# Patient Record
Sex: Male | Born: 1970
Health system: Southern US, Community
[De-identification: ages and names within clinical notes are randomized; demographics above are authoritative.]

## PROBLEM LIST (undated history)

## (undated) DIAGNOSIS — F419 Anxiety disorder, unspecified: Secondary | ICD-10-CM

## (undated) DIAGNOSIS — K5790 Diverticulosis of intestine, part unspecified, without perforation or abscess without bleeding: Secondary | ICD-10-CM

## (undated) DIAGNOSIS — D126 Benign neoplasm of colon, unspecified: Secondary | ICD-10-CM

## (undated) DIAGNOSIS — F329 Major depressive disorder, single episode, unspecified: Secondary | ICD-10-CM

## (undated) DIAGNOSIS — G4730 Sleep apnea, unspecified: Secondary | ICD-10-CM

## (undated) DIAGNOSIS — F32A Depression, unspecified: Secondary | ICD-10-CM

## (undated) DIAGNOSIS — E079 Disorder of thyroid, unspecified: Secondary | ICD-10-CM

## (undated) HISTORY — DX: Disorder of thyroid, unspecified: E07.9

## (undated) HISTORY — DX: Depression, unspecified: F32.A

## (undated) HISTORY — PX: POLYPECTOMY: SHX149

## (undated) HISTORY — DX: Anxiety disorder, unspecified: F41.9

## (undated) HISTORY — PX: SHOULDER SURGERY: SHX246

## (undated) HISTORY — DX: Diverticulosis of intestine, part unspecified, without perforation or abscess without bleeding: K57.90

## (undated) HISTORY — DX: Major depressive disorder, single episode, unspecified: F32.9

## (undated) HISTORY — DX: Benign neoplasm of colon, unspecified: D12.6

## (undated) HISTORY — PX: WISDOM TOOTH EXTRACTION: SHX21

## (undated) HISTORY — PX: COLONOSCOPY: SHX174

## (undated) HISTORY — DX: Sleep apnea, unspecified: G47.30

---

## 2009-03-17 HISTORY — PX: LAPAROSCOPIC GASTRIC BANDING: SHX1100

## 2009-09-03 ENCOUNTER — Encounter (INDEPENDENT_AMBULATORY_CARE_PROVIDER_SITE_OTHER): Payer: Self-pay | Admitting: *Deleted

## 2009-09-24 ENCOUNTER — Ambulatory Visit: Payer: Self-pay | Admitting: Gastroenterology

## 2009-09-24 ENCOUNTER — Telehealth (INDEPENDENT_AMBULATORY_CARE_PROVIDER_SITE_OTHER): Payer: Self-pay

## 2009-09-27 ENCOUNTER — Encounter: Payer: Self-pay | Admitting: Internal Medicine

## 2009-10-23 ENCOUNTER — Ambulatory Visit (HOSPITAL_COMMUNITY): Admission: RE | Admit: 2009-10-23 | Discharge: 2009-10-23 | Payer: Self-pay | Admitting: Internal Medicine

## 2009-10-23 ENCOUNTER — Ambulatory Visit: Payer: Self-pay | Admitting: Internal Medicine

## 2009-10-25 ENCOUNTER — Encounter: Payer: Self-pay | Admitting: Internal Medicine

## 2009-12-14 ENCOUNTER — Ambulatory Visit: Payer: Self-pay | Admitting: Internal Medicine

## 2009-12-14 ENCOUNTER — Telehealth: Payer: Self-pay | Admitting: Internal Medicine

## 2009-12-14 DIAGNOSIS — K5732 Diverticulitis of large intestine without perforation or abscess without bleeding: Secondary | ICD-10-CM

## 2009-12-14 LAB — CONVERTED CEMR LAB
BUN: 10 mg/dL (ref 6–23)
Basophils Absolute: 0 10*3/uL (ref 0.0–0.1)
Basophils Relative: 0.1 % (ref 0.0–3.0)
CO2: 29 meq/L (ref 19–32)
Calcium: 8.9 mg/dL (ref 8.4–10.5)
Chloride: 102 meq/L (ref 96–112)
Creatinine, Ser: 1.1 mg/dL (ref 0.4–1.5)
Eosinophils Absolute: 0.1 10*3/uL (ref 0.0–0.7)
Eosinophils Relative: 1.3 % (ref 0.0–5.0)
GFR calc non Af Amer: 80.33 mL/min (ref >60)
Glucose, Bld: 90 mg/dL (ref 70–99)
HCT: 43.7 % (ref 39.0–52.0)
Hemoglobin: 14.8 g/dL (ref 13.0–17.0)
Lymphocytes Relative: 19.2 % (ref 12.0–46.0)
Lymphs Abs: 1.7 10*3/uL (ref 0.7–4.0)
MCHC: 34 g/dL (ref 30.0–36.0)
MCV: 85.2 fL (ref 78.0–100.0)
Monocytes Absolute: 0.6 10*3/uL (ref 0.1–1.0)
Monocytes Relative: 6.6 % (ref 3.0–12.0)
Neutro Abs: 6.4 10*3/uL (ref 1.4–7.7)
Neutrophils Relative %: 72.8 % (ref 43.0–77.0)
Platelets: 276 10*3/uL (ref 150.0–400.0)
Potassium: 4.4 meq/L (ref 3.5–5.1)
RBC: 5.12 M/uL (ref 4.22–5.81)
RDW: 13.8 % (ref 11.5–14.6)
Sodium: 140 meq/L (ref 135–145)
WBC: 8.8 10*3/uL (ref 4.5–10.5)

## 2009-12-17 ENCOUNTER — Ambulatory Visit: Payer: Self-pay | Admitting: Internal Medicine

## 2010-01-10 ENCOUNTER — Ambulatory Visit: Payer: Self-pay | Admitting: Internal Medicine

## 2010-01-10 DIAGNOSIS — Z860100 Personal history of colon polyps, unspecified: Secondary | ICD-10-CM | POA: Insufficient documentation

## 2010-04-16 NOTE — Miscellaneous (Signed)
Summary: [facility] PV- weight restriction  Clinical Lists Changes   Pt arrived for PV; weight 408 pounds.  Talked with Darcey Nora re: pt's need to have colonoscopy at hospital.  pt is 40 years old with family hx of colon ca (in father).  Lavonna Rua will give chart to Dr. Leone Payor to review to see if pt is due for colonoscopy now.

## 2010-04-16 NOTE — Assessment & Plan Note (Signed)
Summary: 2 WEEKS ABD. PAIN/? DIVERTICULITIS          Jeffrey Moody   History of Present Illness Primary GI Moody: Jeffrey Moody [facility] Chief Complaint: LLQ abd pain intermittent sharp pains with crampy pains in between x 2 weeks. Pt states he had small BM's after the cramping starts. Pt states he hasn't really ate much the last several weeks to help with the pain and he states it hurts after I eat.Pt has been on a round of ATB's without improvement. History of Present Illness:   40 yo wm with 2 weeks of pain in LLQ and saw PCP Jeffrey Moody) and received 7 days of cipro and metronidazole. It is a bit better but still having alot of pain and is constipated. Has avoided eating due to pain. No fever.    GI Review of Systems    Reports abdominal pain.     Location of  Abdominal pain: LLQ.    Denies acid reflux, belching, bloating, chest pain, dysphagia with liquids, dysphagia with solids, heartburn, loss of appetite, nausea, vomiting, vomiting blood, weight loss, and  weight gain.      Reports change in bowel habits.     Denies anal fissure, black tarry stools, constipation, diarrhea, diverticulosis, fecal incontinence, heme positive stool, hemorrhoids, irritable bowel syndrome, jaundice, light color stool, liver problems, rectal bleeding, and  rectal pain. Preventive Screening-Counseling & Management  Alcohol-Tobacco     Smoking Status: never      Drug Use:  no.      Current Medications (verified): 1)  Wellbutrin Xl 150 Mg Xr24h-Tab (Bupropion Hcl) .... One Tablet By Mouth Once Daily  Allergies (verified): No Known Drug Allergies  Past History:  Past Medical History: Diverticulosis Diverticulitis Obesity Depression  Past Surgical History: Unremarkable  Family History: Family History of Colon Cancer:Father, Cousin , Uncle  Social History: Married Patient has never smoked.  Alcohol Use - yes Daily Caffeine Use Illicit Drug Use - no Smoking Status:  never Drug Use:  no  Review of  Systems       increased sweats no fever  Vital Signs:  Patient profile:   40 year old male Height:      72 inches Weight:      399.38 pounds BMI:     54.36 Pulse rate:   88 / minute Pulse rhythm:   regular BP sitting:   126 / 78  (right arm) Cuff size:   regular  Vitals Entered By: Jeffrey Moody CMA Jeffrey Moody) (December 14, 2009 2:17 PM)  Physical Exam  General:  obese.  NAD Eyes:  anicteric Lungs:  Clear throughout to auscultation. Heart:  Regular rate and rhythm; no murmurs, rubs,  or bruits. Abdomen:  obese, mild-mod tender in deep LLQ BS+ no masses Skin:  moist, acneiform changes abdominal and groin area Psych:  Alert and cooperative. Normal mood and affect.   Impression & Recommendations:  Problem # 1:  DIVERTICULITIS-COLON (ICD-562.11) Assessment New sounds like it is the problem  Augmentin x 2 weeks CT to confirm hydrocodne liquid to low residue diet   Orders: CT Abdomen/Pelvis with Contrast (CT Abd/Pelvis w/con)  Other Orders: TLB-CBC Platelet - w/Differential (85025-CBCD) TLB-BMP (Basic Metabolic Panel-BMET) (80048-METABOL)  Patient Instructions: 1)  Please pick up your medications at your pharmacy. 2)  Your physician has requested that you have the following labwork done today: CBC, BMET (blood counts and electroltes/kidney function) 3)  Start with liquid diet and move to a low-residue diet when feeling better. 4)  You will have a CT scan on 12/17/09 and we will call you with results and plans 5)  Copy sent to : Jeffrey Found, Moody 6)  Diverticular Disease brochure given.  7)  The medication list was reviewed and reconciled.  All changed / newly prescribed medications were explained.  A complete medication list was provided to the patient / caregiver. Prescriptions: HYDROCODONE-ACETAMINOPHEN 5-500 MG TABS (HYDROCODONE-ACETAMINOPHEN) 1-2 by mouth every 4-6 hours as needed for pain  #20 x 0   Entered and Authorized by:   Jeffrey Boop Moody, [facility]   Signed  by:   Jeffrey Boop Moody, [facility] on 12/14/2009   Method used:   Printed then faxed [facility] ...       [facility]  [place] [address] [facility]ail[facility]       [address]       [place], [place]  [address]       Ph: [phone] or [phone]       Fax: [phone]   RxID:   [id number] AMOXICILLIN-POT CLAVULANATE 875-125 MG TABS (AMOXICILLIN-POT CLAVULANATE) 1 by mouth two times a day with food for 2 weeks  #28 x 0   Entered and Authorized by:   Jeffrey Boop Moody, [facility]   Signed by:   Jeffrey Boop Moody, [facility] on 12/14/2009   Method used:   [facility]        [facility]  [place] [address] [facility]retail[facility]       [address]       [place], [place]  [address]       Ph: [phone] or [phone]       Fax: [phone]   RxID:   [id number]

## 2010-04-16 NOTE — Letter (Signed)
Summary: Previsit letter  [facility]  [address] [place], [place] [address]   Phone: [phone]  Fax: [phone]       09/03/2009 MRN: [id number]  [facility] [address] [place], [place]  [address]  Dear Mr. Gato,  Welcome to the Gastroenterology Division at [facility].    You are scheduled to see a nurse for your pre-procedure visit on 09-24-09 at 2:00p.m. on the 3rd floor at [facility], [address]  We ask that you try to arrive at our office 15 minutes prior to your appointment time to allow for check-in.  Your nurse visit will consist of discussing your medical and surgical history, your immediate family medical history, and your medications.    Please bring a complete list of all your medications or, if you prefer, bring the medication bottles and we will list them.  We will need to be aware of both prescribed and over the counter drugs.  We will need to know exact dosage information as well.  If you are on blood thinners (Coumadin, Plavix, Aggrenox, Ticlid, etc.) please call our office today/prior to your appointment, as we need to consult with your physician about holding your medication.   Please be prepared to read and sign documents such as consent forms, a financial agreement, and acknowledgement forms.  If necessary, and with your consent, a friend or relative is welcome to sit-in on the nurse visit with you.  Please bring your insurance card so that we may make a copy of it.  If your insurance requires a referral to see a specialist, please bring your referral form from your primary care physician.  No co-pay is required for this nurse visit.     If you cannot keep your appointment, please call [phone] to cancel or reschedule prior to your appointment date.  This allows [place] the opportunity to schedule an appointment for another patient in need of care.    Thank you for choosing [facility] for your medical  needs.  We appreciate the opportunity to care for you.  Please visit [place] at our website  to learn more about our practice.                     Sincerely.                                                                                                                   The Gastroenterology Division

## 2010-04-16 NOTE — Progress Notes (Signed)
Summary: Triage-Abd. Pain  Phone Note Call from Patient Call back at Work Phone [phone]   Caller: Patient Call For: Dr. Leone Payor Reason for Call: Talk to Nurse Summary of Call: Severe abd pain/cramping x2 wks Initial call taken by: Karna Christmas,  December 14, 2009 8:29 AM  Follow-up for Phone Call        Message left for patient to callback. Laureen Ochs LPN  December 14, 2009 10:29 AM  Had a Colonoscopy 10-23-09. Pt. c/o 2 weeks of lower abd. cramping, pain is intermittent. Pt. modified his diet, pain is remaining the same. May be constipated/bloated. "I almost feel like I'm in a knot." Denies fever, blood, black stools, n/v.  PCP put him on Cipro/Flagyl 2 weeks ago, not much improvement.   DR.GESSNER PLEASE ADVISE  Follow-up by: Laureen Ochs LPN,  December 14, 2009 10:57 AM  Additional Follow-up for Phone Call Additional follow up Details #1::        Please get a bowel moement hx (how many in the lastfew dasy) if he is constipated then he may just need laxatives he could be seen at 215 today if necessary if he has not had a good bm in past few days I would recomend laxatives...let me know  if he had his obesity surgery then he should be talkingto his surgeon Additional Follow-up by: Iva Boop MD, [facility] Graham,  December 14, 2009 11:38 AM    Additional Follow-up for Phone Call Additional follow up Details #2::    Last BM was "Technically" this morning, had a small movement, little pellets.  Had a substantial BM about 2 weeks ago, "I have been eating almost nothing because of this pain." Pt. has not had bariatric surgery. Pt. would prefer to be seen today, so he will come for the 2:15pm appt. today.  Follow-up by: Laureen Ochs LPN,  December 14, 2009 11:48 AM

## 2010-04-16 NOTE — Letter (Signed)
Summary: Patient Notice- Polyp Results  [facility]  [address] [place], [place] [address]   Phone: [phone]  Fax: [phone]        October 25, 2009 MRN: [id number]    [facility] [address] [place], [place]  [address]    Dear [name],  The polyp removed from your colon was adenomatous. This means that it was pre-cancerous or that  it had the potential to change into cancer over time.  I recommend that you have a repeat colonoscopy in 3 years given this finding and your family history of colon cancer. If you develop any new rectal bleeding, abdominal pain or significant bowel habit changes, please contact us before then.  I also recommend that you pursue genetic testing as we discussed and you may contact me to arrange this.  Please call us if you are having persistent problems or have questions about your condition that have not been fully answered at this time.  Good luck with your bariatric surgery!  Sincerely,  Iva Boop MD, [facility]  This letter has been electronically signed by your physician.  Appended Document: Patient Notice- Polyp Results Letter mailed to patient. Recall is in IDX for 10/2012.

## 2010-04-16 NOTE — Letter (Signed)
Summary: [facility] Instructions  [facility]  [address] [place], [place] [address]   Phone: [phone]  Fax: [phone]       Jeffrey Moody    Jan 03, 1971    MRN: [id number]        Procedure Day /Date: Tuesday 10/23/09     Arrival Time:7:30 am      Procedure Time: 8:30 am     Location of Procedure:                     _x _  [facility] ( Outpatient Registration)   PREPARATION FOR COLONOSCOPY WITH MOVIPREP   Starting 5 days prior to your procedure 10/18/09 do not eat nuts, seeds, popcorn, corn, beans, peas,  salads, or any raw vegetables.  Do not take any fiber supplements (e.g. Metamucil, Citrucel, and Benefiber).  THE DAY BEFORE YOUR PROCEDURE         DATE: 10/22/09  DAY: Monday  1.  Drink clear liquids the entire day-NO SOLID FOOD  2.  Do not drink anything colored red or purple.  Avoid juices with pulp.  No orange juice.  3.  Drink at least 64 oz. (8 glasses) of fluid/clear liquids during the day to prevent dehydration and help the prep work efficiently.  CLEAR LIQUIDS INCLUDE: Water Jello Ice Popsicles Tea (sugar ok, no milk/cream) Powdered fruit flavored drinks Coffee (sugar ok, no milk/cream) Gatorade Juice: apple, white grape, white cranberry  Lemonade Clear bullion, consomm, broth Carbonated beverages (any kind) Strained chicken noodle soup Hard Candy                             4.  In the morning, mix first dose of MoviPrep solution:    Empty 1 Pouch A and 1 Pouch B into the disposable container    Add lukewarm drinking water to the top line of the container. Mix to dissolve    Refrigerate (mixed solution should be used within 24 hrs)  5.  Begin drinking the prep at 5:00 p.m. The MoviPrep container is divided by 4 marks.   Every 15 minutes drink the solution down to the next mark (approximately 8 oz) until the full liter is complete.   6.  Follow completed prep with 16 oz of clear liquid of your choice (Nothing red or  purple).  Continue to drink clear liquids until bedtime.  7.  Before going to bed, mix second dose of MoviPrep solution:    Empty 1 Pouch A and 1 Pouch B into the disposable container    Add lukewarm drinking water to the top line of the container. Mix to dissolve    Refrigerate  THE DAY OF YOUR PROCEDURE      DATE: 10/23/09 DAY: Tuesday  Beginning at 3:30  am 5 hours prior to the procedure         1. Every 15 minutes, drink the solution down to the next mark (approx 8 oz) until the full liter is complete.  2. Follow completed prep with 16 oz. of clear liquid of your choice.    3. You may drink clear liquids until 4:30 am 4 hours prior to the procedure   MEDICATION INSTRUCTIONS  Unless otherwise instructed, you should take regular prescription medications with a small sip of water   as early as possible the morning of your procedure.  Diabetic patients - see separate instructions.  OTHER INSTRUCTIONS  You will need a responsible adult at least 39 years of age to accompany you and drive you home.   This person must remain in the waiting room during your procedure.  Wear loose fitting clothing that is easily removed.  Leave jewelry and other valuables at home.  However, you may wish to bring a book to read or  an iPod/MP3 player to listen to music as you wait for your procedure to start.  Remove all body piercing jewelry and leave at home.  Total time from sign-in until discharge is approximately 2-3 hours.  You should go home directly after your procedure and rest.  You can resume normal activities the  day after your procedure.  The day of your procedure you should not:   Drive   Make legal decisions   Operate machinery   Drink alcohol   Return to work  You will receive specific instructions about eating, activities and medications before you leave.    The above instructions have been reviewed and explained to me by   Darcey Nora, RN    I  fully understand and can verbalize these instructions , mailed to patient' s home

## 2010-04-16 NOTE — Progress Notes (Signed)
Summary: ? due for screening colon  Phone Note Other Incoming   Summary of Call: 40 y/o patient  here today for a pre-visit for a screening colon he is 408 lbs. While trying to arrange a hospital procedure he reported a  family hx of colon CA in his father that was dignosed at age 66.  Patient had a colon 10 years ago while inpateint for diverticulitis, he had no polyps, just diverticulosis (it was in [place] he is unsure where).  He did have a sister that is 24 and had polyps on her colon, he is unsure of the kind (he will try and contact her about the type).  He currently has no GI symptoms or complaints.  He is preparing to have gastric by-pass at [facility] in November.    He was referred  for a family hx of colon CA.  Dr Leone Payor please advise if appropriate to schedule screening colon now at the hospital.  Patient  has a hx of sleep apnea, hypothyroid,  and borderline HTN.   Initial call taken by: Darcey Nora RN, CGRN,  September 24, 2009 2:42 PM  Follow-up for Phone Call        with the hx of dad and sister it is reasonable to proceed with a colonoscopy now (at hospital) for higher-rsk screening I think 41 would be typical age but think it makes sense at 12 given pending bypass surgery and overall hx Follow-up by: Iva Boop MD, Clementeen Graham,  September 26, 2009 2:19 PM  Additional Follow-up for Phone Call Additional follow up Details #1::        Left message for patient to call back Darcey Nora RN, [facility]  September 26, 2009 3:04 PM  Left message for patient to call back if he is interested in persuing colon at this time.  I have left him a message with Dr Marvell Fuller response. Additional Follow-up by: Darcey Nora RN, CGRN,  September 27, 2009 12:16 PM     Appended Document: ? due for screening colon Patient  returned call patient's father was actually diagnosed at age 30 with colon CA.  He is scheduled at [facility] for 10/23/09 8:30.  I will mail him instructions  Appended Document: ? due for screening  colon    Clinical Lists Changes  Medications: Added new medication of MOVIPREP 100 GM  SOLR (PEG-KCL-NACL-NASULF-NA ASC-C) As per prep instructions. - Signed Rx of MOVIPREP 100 GM  SOLR (PEG-KCL-NACL-NASULF-NA ASC-C) As per prep instructions.;  #1 x 0;  Signed;  Entered by: Darcey Nora RN, CGRN;  Authorized by: Iva Boop MD, [facility];  Method used[facility]  [place] [address] [place], [place], [place]  [address], Ph: [phone] or [phone], Fax: [phone]    Prescriptions: MOVIPREP 100 GM  SOLR (PEG-KCL-NACL-NASULF-NA ASC-C) As per prep instructions.  #1 x 0   Entered by:   Darcey Nora RN, CGRN   Authorized by:   Iva Boop MD, [facility]   Signed by:   Darcey Nora RN, CGRN on 09/27/2009   Method used:   [facility]        [facility]  [place] [address] [facility]       [address]       [place], [place]  [address]       Ph: [phone] or [phone]       Fax: [phone]   RxID:   [id number]

## 2010-04-16 NOTE — Procedures (Signed)
Summary: Colonoscopy  Patient: Jeffrey Moody Note: All result statuses are Final unless otherwise noted.  Tests: (1) Colonoscopy (COL)   COL Colonoscopy           DONE     [facility]     [address] [place], [place]  [address]           COLONOSCOPY PROCEDURE REPORT           PATIENT:  Jeffrey Moody, Jeffrey Moody  MR#:  [id number]     BIRTHDATE:  [birth year], 39 yrs. old  GENDER:  male     ENDOSCOPIST:  Iva Boop, MD, [facility]     REF. BY:  Assunta Found, M.D.     PROCEDURE DATE:  10/23/2009     PROCEDURE:  Colonoscopy with snare polypectomy     ASA CLASS:  Class III     INDICATIONS:  family history of colon cancer increased risk     screening     colon cancer in father (age 8), paternal uncle and a second     cousin     sister had polyps at 76     MEDICATIONS:   Fentanyl 100 mcg, Versed 8 mg           DESCRIPTION OF PROCEDURE:   After the risks benefits and     alternatives of the procedure were thoroughly explained, informed     consent was obtained.  Digital rectal exam was performed and     revealed no abnormalities and normal prostate.   The  endoscope     was introduced through the anus and advanced to the cecum, which     was identified by both the appendix and ileocecal valve, without     limitations.  The quality of the prep was excellent, using     MoviPrep.  The instrument was then slowly withdrawn as the colon     was fully examined. Insertion: 4:30 minutes Withdrawal: 9 minutes.     <<PROCEDUREIMAGES>>           FINDINGS:  A sessile polyp was found in the proximal transverse     colon. It was 6 mm in size. Polyp was snared without cautery.     Retrieval was successful.  Severe diverticulosis was found in the     sigmoid colon.  This was otherwise a normal examination of the     colon.   Retroflexed views in the rectum revealed no     abnormalities.    The scope was then withdrawn from the patient     and the procedure completed.           COMPLICATIONS:   None     ENDOSCOPIC IMPRESSION:     1) 6 mm sessile polyp in the proximal transverse colon - removed           2) Severe diverticulosis in the sigmoid colon     3) Otherwise normal examination - excellent prep     4) Family history of colon cancer (father, uncle and cousin).     RECOMMENDATIONS:     I have recommended he pursue genetic testing and that he should     discuss that with his father to see what could be coordinated. we     discussed this prior to sedation. I can arrange genetic     counselling for him here in [place] - he knows to call.     REPEAT EXAM:  In for Colonoscopy, pending biopsy results. at least     by 5 years.           Iva Boop, MD, [facility] Graham           CC:  Assunta Found, MD     Pearson Forster, MD [facility])     The Patient           n.     eSIGNED:   Iva Boop at 10/23/2009 08:57 AM           Daine Floras, [phone]  Note: An exclamation mark (!) indicates a result that was not dispersed into the flowsheet. Document Creation Date: 10/23/2009 8:59 AM _______________________________________________________________________  (1) Order result status: Final Collection or observation date-time: 10/23/2009 08:49 Requested date-time:  Receipt date-time:  Reported date-time:  Referring Physician:   Ordering Physician: Stan Head [id number]) Specimen Source:  Source: Launa Grill Order Number: [id number] Lab site:   Appended Document: Colonoscopy Recall is in IDX for 10/2012.  Appended Document: Colonoscopy:    Colonoscopy  Procedure date:  10/23/2009  Findings:      ENDOSCOPIC IMPRESSION:     1) 6 mm sessile polyp in the proximal transverse colon - removed       2) Severe diverticulosis in the sigmoid colon     3) Otherwise normal examination - excellent prep     4) Family history of colon cancer (father, uncle and cousin).  ***Microscopic Examination and Diagnosis***  1. COLON, POLYP(S), TRANSVERSE : FRAGMENTS OF SESSILE  SERRATED ADENOMA. NO HIGH GRADE DYSPLASIA OR EVIDENCE OF MALIGNANCY.  Procedures Next Due Date:    Colonoscopy: 10/2012

## 2010-04-16 NOTE — Assessment & Plan Note (Signed)
Summary: FOLLOW UP DIVERTICULITIS//SP   History of Present Illness Visit Type: Follow-up Visit Primary GI MD: Stan Head MD [facility] Primary Endiya Klahr: Assunta Found, MD  Requesting Isamar Wellbrock: na Chief Complaint: diverticulitis  History of Present Illness:   40 yo wm with recent CT-confirmed diverticulitis. After 2 weeks of Augmentin he has no more abdominal pain or other symptoms. Hoping to have bariatric surgery in Nov or Dec.           Clinical Reports Reviewed:  Colonoscopy:  10/23/2009:  ENDOSCOPIC IMPRESSION:     1) 6 mm sessile polyp in the proximal transverse colon - removed       2) Severe diverticulosis in the sigmoid colon     3) Otherwise normal examination - excellent prep     4) Family history of colon cancer (father, uncle and cousin).  ***Microscopic Examination and Diagnosis***  1. COLON, POLYP(S), TRANSVERSE : FRAGMENTS OF SESSILE SERRATED ADENOMA. NO HIGH GRADE DYSPLASIA OR EVIDENCE OF MALIGNANCY.   Current Medications (verified): 1)  Wellbutrin Xl 150 Mg Xr24h-Tab (Bupropion Hcl) .... One Tablet By Mouth Once Daily  Allergies (verified): No Known Drug Allergies  Past History:  Past Medical History: Diverticulosis Diverticulitis Obesity Depression Adenomatous colon polyp Fatty Liver Disease  Past Surgical History: Reviewed history from 12/14/2009 and no changes required. Unremarkable  Family History: Reviewed history from 12/14/2009 and no changes required. Family History of Colon Cancer:Father, Cousin , Uncle  Social History: Occupation:[employer]  Married Patient has never smoked.  Alcohol Use - yes Daily Caffeine Use Illicit Drug Use - no  Vital Signs:  Patient profile:   40 year old male Height:      72 inches Weight:      394 pounds BMI:     53.63 BSA:     2.84 Pulse rate:   96 / minute Pulse rhythm:   regular BP sitting:   132 / 76  (left arm) Cuff size:   large  Vitals Entered By: Ok Anis CMA (January 10, 2010 1:57  PM)  Physical Exam  General:  obese.  NAD Abdomen:  obese,nontender   Impression & Recommendations:  Problem # 1:  DIVERTICULITIS-COLON (ICD-562.11) Assessment Improved Resolved. Follow-up as needed  Problem # 2:  COLONIC POLYPS, ADENOMATOUS, HX OF (ICD-V12.72) with family history of colon cancer considering genetic screening would be sensible at some point. Handout provided.  Patient Instructions: 1)  Copy sent to : Assunta Found, MD and Pearson Forster, MD [facility]) 2)  Call us back if you would like to pursue genetic screening for your family history of colon cancer. 3)  The medication list was reviewed and reconciled.  All changed / newly prescribed medications were explained.  A complete medication list was provided to the patient / caregiver.

## 2010-08-07 ENCOUNTER — Other Ambulatory Visit: Payer: Self-pay | Admitting: Dermatology

## 2012-10-14 ENCOUNTER — Encounter: Payer: Self-pay | Admitting: Internal Medicine

## 2013-06-06 ENCOUNTER — Encounter: Payer: Self-pay | Admitting: Internal Medicine

## 2015-07-24 DIAGNOSIS — F341 Dysthymic disorder: Secondary | ICD-10-CM | POA: Diagnosis not present

## 2015-07-24 DIAGNOSIS — F329 Major depressive disorder, single episode, unspecified: Secondary | ICD-10-CM | POA: Diagnosis not present

## 2015-07-24 DIAGNOSIS — F900 Attention-deficit hyperactivity disorder, predominantly inattentive type: Secondary | ICD-10-CM | POA: Diagnosis not present

## 2015-10-24 DIAGNOSIS — Z0001 Encounter for general adult medical examination with abnormal findings: Secondary | ICD-10-CM | POA: Diagnosis not present

## 2015-10-24 DIAGNOSIS — Z6841 Body Mass Index (BMI) 40.0 and over, adult: Secondary | ICD-10-CM | POA: Diagnosis not present

## 2015-10-24 DIAGNOSIS — Z9884 Bariatric surgery status: Secondary | ICD-10-CM | POA: Diagnosis not present

## 2015-10-24 DIAGNOSIS — R946 Abnormal results of thyroid function studies: Secondary | ICD-10-CM | POA: Diagnosis not present

## 2015-10-24 DIAGNOSIS — Z1389 Encounter for screening for other disorder: Secondary | ICD-10-CM | POA: Diagnosis not present

## 2015-10-24 DIAGNOSIS — R7989 Other specified abnormal findings of blood chemistry: Secondary | ICD-10-CM | POA: Diagnosis not present

## 2015-10-31 DIAGNOSIS — F329 Major depressive disorder, single episode, unspecified: Secondary | ICD-10-CM | POA: Diagnosis not present

## 2015-10-31 DIAGNOSIS — F900 Attention-deficit hyperactivity disorder, predominantly inattentive type: Secondary | ICD-10-CM | POA: Diagnosis not present

## 2015-10-31 DIAGNOSIS — F341 Dysthymic disorder: Secondary | ICD-10-CM | POA: Diagnosis not present

## 2015-12-13 DIAGNOSIS — G4733 Obstructive sleep apnea (adult) (pediatric): Secondary | ICD-10-CM | POA: Diagnosis not present

## 2015-12-17 DIAGNOSIS — Z6841 Body Mass Index (BMI) 40.0 and over, adult: Secondary | ICD-10-CM | POA: Diagnosis not present

## 2015-12-17 DIAGNOSIS — E0590 Thyrotoxicosis, unspecified without thyrotoxic crisis or storm: Secondary | ICD-10-CM | POA: Diagnosis not present

## 2015-12-20 DIAGNOSIS — G4733 Obstructive sleep apnea (adult) (pediatric): Secondary | ICD-10-CM | POA: Diagnosis not present

## 2016-01-20 DIAGNOSIS — G4733 Obstructive sleep apnea (adult) (pediatric): Secondary | ICD-10-CM | POA: Diagnosis not present

## 2016-01-24 DIAGNOSIS — F341 Dysthymic disorder: Secondary | ICD-10-CM | POA: Diagnosis not present

## 2016-01-24 DIAGNOSIS — F329 Major depressive disorder, single episode, unspecified: Secondary | ICD-10-CM | POA: Diagnosis not present

## 2016-01-24 DIAGNOSIS — F900 Attention-deficit hyperactivity disorder, predominantly inattentive type: Secondary | ICD-10-CM | POA: Diagnosis not present

## 2016-01-31 DIAGNOSIS — E039 Hypothyroidism, unspecified: Secondary | ICD-10-CM | POA: Diagnosis not present

## 2016-02-19 DIAGNOSIS — G4733 Obstructive sleep apnea (adult) (pediatric): Secondary | ICD-10-CM | POA: Diagnosis not present

## 2016-02-27 DIAGNOSIS — E162 Hypoglycemia, unspecified: Secondary | ICD-10-CM | POA: Diagnosis not present

## 2016-03-21 DIAGNOSIS — G4733 Obstructive sleep apnea (adult) (pediatric): Secondary | ICD-10-CM | POA: Diagnosis not present

## 2016-04-21 DIAGNOSIS — G4733 Obstructive sleep apnea (adult) (pediatric): Secondary | ICD-10-CM | POA: Diagnosis not present

## 2016-05-19 DIAGNOSIS — G4733 Obstructive sleep apnea (adult) (pediatric): Secondary | ICD-10-CM | POA: Diagnosis not present

## 2016-06-03 DIAGNOSIS — F900 Attention-deficit hyperactivity disorder, predominantly inattentive type: Secondary | ICD-10-CM | POA: Diagnosis not present

## 2016-06-03 DIAGNOSIS — F341 Dysthymic disorder: Secondary | ICD-10-CM | POA: Diagnosis not present

## 2016-06-03 DIAGNOSIS — F329 Major depressive disorder, single episode, unspecified: Secondary | ICD-10-CM | POA: Diagnosis not present

## 2016-06-19 DIAGNOSIS — G4733 Obstructive sleep apnea (adult) (pediatric): Secondary | ICD-10-CM | POA: Diagnosis not present

## 2016-07-19 DIAGNOSIS — G4733 Obstructive sleep apnea (adult) (pediatric): Secondary | ICD-10-CM | POA: Diagnosis not present

## 2016-08-14 ENCOUNTER — Encounter: Payer: Self-pay | Admitting: Internal Medicine

## 2016-08-19 DIAGNOSIS — G4733 Obstructive sleep apnea (adult) (pediatric): Secondary | ICD-10-CM | POA: Diagnosis not present

## 2016-08-25 ENCOUNTER — Ambulatory Visit (AMBULATORY_SURGERY_CENTER): Payer: Self-pay | Admitting: *Deleted

## 2016-08-25 VITALS — Ht 70.5 in | Wt 208.0 lb

## 2016-08-25 DIAGNOSIS — Z86010 Personal history of colonic polyps: Secondary | ICD-10-CM

## 2016-08-25 DIAGNOSIS — Z800 Family history of malignant neoplasm of digestive organs: Secondary | ICD-10-CM

## 2016-08-25 NOTE — Progress Notes (Signed)
No egg or soy allergy known to patient  No issues with past sedation with any surgeries  or procedures, no intubation problems  No diet pills per patient No home 02 use per patient  No blood thinners per patient  Pt denies issues with constipation  No A fib or A flutter  EMMI video declined    

## 2016-08-27 ENCOUNTER — Encounter: Payer: Self-pay | Admitting: Internal Medicine

## 2016-09-05 ENCOUNTER — Encounter: Payer: Self-pay | Admitting: Internal Medicine

## 2016-09-05 ENCOUNTER — Ambulatory Visit (AMBULATORY_SURGERY_CENTER): Payer: BLUE CROSS/BLUE SHIELD | Admitting: Internal Medicine

## 2016-09-05 VITALS — BP 109/68 | HR 64 | Temp 99.3°F | Resp 13 | Ht 70.0 in | Wt 208.0 lb

## 2016-09-05 DIAGNOSIS — D122 Benign neoplasm of ascending colon: Secondary | ICD-10-CM

## 2016-09-05 DIAGNOSIS — D120 Benign neoplasm of cecum: Secondary | ICD-10-CM | POA: Diagnosis not present

## 2016-09-05 DIAGNOSIS — Z86010 Personal history of colonic polyps: Secondary | ICD-10-CM | POA: Diagnosis not present

## 2016-09-05 MED ORDER — SODIUM CHLORIDE 0.9 % IV SOLN
500.0000 mL | INTRAVENOUS | Status: DC
Start: 2016-09-05 — End: 2016-09-05

## 2016-09-05 NOTE — Progress Notes (Signed)
Called to room to assist during endoscopic procedure.  Patient ID and intended procedure confirmed with present staff. Received instructions for my participation in the procedure from the performing physician.  

## 2016-09-05 NOTE — Op Note (Signed)
[facility] Patient Name: Jeffrey Moody Procedure Date: 09/05/2016 4:44 PM MRN: [id number] Endoscopist: Gatha Mayer , MD Age: 46 Referring MD:  Date of Birth: September 20, 1970 Gender: Male Account #: [id number] Procedure:                Colonoscopy Indications:              Surveillance: Personal history of adenomatous                            polyps on last colonoscopy > 5 years ago, Family                            history of colon cancer in a first-degree relative,                            Family history of colon cancer in multiple                            second-degree relatives Medicines:                Propofol per Anesthesia, Monitored Anesthesia Care Procedure:                Pre-Anesthesia Assessment:                           - Prior to the procedure, a History and Physical                            was performed, and patient medications and                            allergies were reviewed. The patient's tolerance of                            previous anesthesia was also reviewed. The risks                            and benefits of the procedure and the sedation                            options and risks were discussed with the patient.                            All questions were answered, and informed consent                            was obtained. Prior Anticoagulants: The patient has                            taken no previous anticoagulant or antiplatelet                            agents. ASA Grade Assessment: II - A patient with  mild systemic disease. After reviewing the risks                            and benefits, the patient was deemed in                            satisfactory condition to undergo the procedure.                           After obtaining informed consent, the colonoscope                            was passed under direct vision. Throughout the                            procedure, the patient's  blood pressure, pulse, and                            oxygen saturations were monitored continuously. The                            Colonoscope was introduced through the anus and                            advanced to the the cecum, identified by                            appendiceal orifice and ileocecal valve. The                            colonoscopy was performed without difficulty. The                            patient tolerated the procedure well. The quality                            of the bowel preparation was adequate. The                            ileocecal valve, appendiceal orifice, and rectum                            were photographed. Scope In: 4:48:45 PM Scope Out: 5:30:58 PM Scope Withdrawal Time: 0 hours 34 minutes 22 seconds  Total Procedure Duration: 0 hours 42 minutes 13 seconds  Findings:                 The perianal exam findings include small nodule                            left posterior area.                           The digital rectal exam was normal.  A 15 mm polyp was found in the ascending colon. The                            polyp was semi-pedunculated. The polyp was removed                            with a hot snare. Resection and retrieval were                            complete. Estimated blood loss: 15 mL requiring                            treatment with Immediate bleeding after snare                            cautery with pulse cut slow - applied cautery with                            forced coag 1 and it slowed, 2 clips applied - then                            1 fell off. Bleeding had stopped - .EPI 1:10K x 3                            cc injected. no bleeding.                           Two sessile polyps were found in the ascending                            colon and cecum. The polyps were diminutive in                            size. These polyps were removed with a cold snare.                             Resection and retrieval were complete. Verification                            of patient identification for the specimen was                            done. Estimated blood loss was minimal.                           Multiple small and large-mouthed diverticula were                            found in the entire colon.                           The exam was otherwise without abnormality on  direct and retroflexion views. Complications:            No immediate complications. Estimated Blood Loss:     Estimated blood loss: 15 mL. Impression:               - Small nodule left posterior area. found on                            perianal exam.                           - One 15 mm polyp in the ascending colon, removed                            with a hot snare. Resected and retrieved.                           - Two diminutive polyps in the ascending colon and                            in the cecum, removed with a cold snare. Resected                            and retrieved.                           - Diverticulosis in the entire examined colon.                           - The examination was otherwise normal on direct                            and retroflexion views.                           - Personal history of colonic polyps. And family                            history of colon cancer - need to reconsider                            genetic counseling Recommendation:           - Patient has a contact number available for                            emergencies. The signs and symptoms of potential                            delayed complications were discussed with the                            patient. Return to normal activities tomorrow.                            Written discharge instructions were provided to  the                            patient.                           - Resume previous diet.                           - Continue present  medications.                           - No aspirin, ibuprofen, naproxen, or other                            non-steroidal anti-inflammatory drugs for 2 weeks                            after polyp removal.                           - Light activity only until 6/25                           - Repeat colonoscopy is recommended for                            surveillance. The colonoscopy date will be                            determined after pathology results from today's                            exam become available for review. Gatha Mayer, MD 09/05/2016 5:46:11 PM This report has been signed electronically.

## 2016-09-05 NOTE — Progress Notes (Signed)
To PACU, VSS. Report to [email or web address] 

## 2016-09-05 NOTE — Patient Instructions (Addendum)
I found and removed 3 polyps - one was large and there was bleeding after I removed it. This was stopped with treatment.  The polyps look benign. I will let you know pathology results and when to have another routine colonoscopy by mail and/or My Chart.  You also have a condition called diverticulosis - common and not usually a problem. Please read the handout provided.  Please take it easy this weekend - no cycling and no aspirin, naproxen(Aleve) or ibuprofen x 2 weeks.  There is a small nodule in the anal area - it must be what you feel. It might need to be removed by a surgeon if it does not go away.  I appreciate the opportunity to care for you. Gatha Mayer, MD, [facility]  Polyp handout given to patient. Diverticulosis handout given to patient.  No aspiring, ibuprofen, naproxen, or other NSAID drugs for 2 weeks.  Tylenol only until September 20, 2016.  Light activity only until 09/08/2016.  YOU HAD AN ENDOSCOPIC PROCEDURE TODAY AT [facility]:   Refer to the procedure report that was given to you for any specific questions about what was found during the examination.  If the procedure report does not answer your questions, please call your gastroenterologist to clarify.  If you requested that your care partner not be given the details of your procedure findings, then the procedure report has been included in a sealed envelope for you to review at your convenience later.  YOU SHOULD EXPECT: Some feelings of bloating in the abdomen. Passage of more gas than usual.  Walking can help get rid of the air that was put into your GI tract during the procedure and reduce the bloating. If you had a lower endoscopy (such as a colonoscopy or flexible sigmoidoscopy) you may notice spotting of blood in your stool or on the toilet paper. If you underwent a bowel prep for your procedure, you may not have a normal bowel movement for a few days.  Please Note:  You might notice some  irritation and congestion in your nose or some drainage.  This is from the oxygen used during your procedure.  There is no need for concern and it should clear up in a day or so.  SYMPTOMS TO REPORT IMMEDIATELY:   Following lower endoscopy (colonoscopy or flexible sigmoidoscopy):  Excessive amounts of blood in the stool  Significant tenderness or worsening of abdominal pains  Swelling of the abdomen that is new, acute  Fever of 100F or higher  For urgent or emergent issues, a gastroenterologist can be reached at any hour by calling [phone].   DIET:  We do recommend a small meal at first, but then you may proceed to your regular diet.  Drink plenty of fluids but you should avoid alcoholic beverages for 24 hours.  ACTIVITY:  You should plan to take it easy for the rest of today and you should NOT DRIVE or use heavy machinery until tomorrow (because of the sedation medicines used during the test).    FOLLOW UP: Our staff will call the number listed on your records the next business day following your procedure to check on you and address any questions or concerns that you may have regarding the information given to you following your procedure. If we do not reach you, we will leave a message.  However, if you are feeling well and you are not experiencing any problems, there is no need to return our call.  We will assume that you have returned to your regular daily activities without incident.  If any biopsies were taken you will be contacted by phone or by letter within the next 1-3 weeks.  Please call us at [phone] if you have not heard about the biopsies in 3 weeks.    SIGNATURES/CONFIDENTIALITY: You and/or your care partner have signed paperwork which will be entered into your electronic medical record.  These signatures attest to the fact that that the information above on your After Visit Summary has been reviewed and is understood.  Full responsibility of the  confidentiality of this discharge information lies with you and/or your care-partner.

## 2016-09-08 ENCOUNTER — Encounter: Payer: Self-pay | Admitting: Internal Medicine

## 2016-09-08 ENCOUNTER — Telehealth: Payer: Self-pay | Admitting: *Deleted

## 2016-09-08 NOTE — Telephone Encounter (Signed)
  Follow up Call-  Call back number 09/05/2016  Post procedure Call Back phone  # [phone]  Permission to leave phone message Yes  Some recent data might be hidden     Patient questions:  Do you have a fever, pain , or abdominal swelling? No. Pain Score  0 *  Have you tolerated food without any problems? Yes.    Have you been able to return to your normal activities? Yes.    Do you have any questions about your discharge instructions: Diet   No. Medications  No. Follow up visit  No.  Do you have questions or concerns about your Care? No.  Actions: * If pain score is 4 or above: No action needed, pain <4.

## 2016-09-11 ENCOUNTER — Encounter: Payer: Self-pay | Admitting: Internal Medicine

## 2016-09-11 DIAGNOSIS — Z86010 Personal history of colonic polyps: Secondary | ICD-10-CM

## 2016-09-11 NOTE — Progress Notes (Signed)
3 adenomas max 15 mm Recall 2021

## 2016-09-18 DIAGNOSIS — G4733 Obstructive sleep apnea (adult) (pediatric): Secondary | ICD-10-CM | POA: Diagnosis not present

## 2016-09-30 DIAGNOSIS — F341 Dysthymic disorder: Secondary | ICD-10-CM | POA: Diagnosis not present

## 2016-09-30 DIAGNOSIS — F900 Attention-deficit hyperactivity disorder, predominantly inattentive type: Secondary | ICD-10-CM | POA: Diagnosis not present

## 2016-09-30 DIAGNOSIS — F329 Major depressive disorder, single episode, unspecified: Secondary | ICD-10-CM | POA: Diagnosis not present

## 2016-10-19 DIAGNOSIS — G4733 Obstructive sleep apnea (adult) (pediatric): Secondary | ICD-10-CM | POA: Diagnosis not present

## 2017-02-10 DIAGNOSIS — F341 Dysthymic disorder: Secondary | ICD-10-CM | POA: Diagnosis not present

## 2017-02-10 DIAGNOSIS — F900 Attention-deficit hyperactivity disorder, predominantly inattentive type: Secondary | ICD-10-CM | POA: Diagnosis not present

## 2017-02-10 DIAGNOSIS — F329 Major depressive disorder, single episode, unspecified: Secondary | ICD-10-CM | POA: Diagnosis not present

## 2017-02-16 DIAGNOSIS — E039 Hypothyroidism, unspecified: Secondary | ICD-10-CM | POA: Diagnosis not present

## 2017-02-27 DIAGNOSIS — G4733 Obstructive sleep apnea (adult) (pediatric): Secondary | ICD-10-CM | POA: Diagnosis not present

## 2017-04-03 DIAGNOSIS — R946 Abnormal results of thyroid function studies: Secondary | ICD-10-CM | POA: Diagnosis not present

## 2017-04-03 DIAGNOSIS — E162 Hypoglycemia, unspecified: Secondary | ICD-10-CM | POA: Diagnosis not present

## 2017-04-03 DIAGNOSIS — Z1389 Encounter for screening for other disorder: Secondary | ICD-10-CM | POA: Diagnosis not present

## 2017-04-03 DIAGNOSIS — E039 Hypothyroidism, unspecified: Secondary | ICD-10-CM | POA: Diagnosis not present

## 2017-04-03 DIAGNOSIS — Z6828 Body mass index (BMI) 28.0-28.9, adult: Secondary | ICD-10-CM | POA: Diagnosis not present

## 2017-04-03 DIAGNOSIS — G4730 Sleep apnea, unspecified: Secondary | ICD-10-CM | POA: Diagnosis not present

## 2017-04-03 DIAGNOSIS — R7309 Other abnormal glucose: Secondary | ICD-10-CM | POA: Diagnosis not present

## 2017-04-03 DIAGNOSIS — Z23 Encounter for immunization: Secondary | ICD-10-CM | POA: Diagnosis not present

## 2017-04-08 DIAGNOSIS — Z9884 Bariatric surgery status: Secondary | ICD-10-CM | POA: Diagnosis not present

## 2017-04-08 DIAGNOSIS — T730XXA Starvation, initial encounter: Secondary | ICD-10-CM | POA: Diagnosis not present

## 2017-04-08 DIAGNOSIS — Z4651 Encounter for fitting and adjustment of gastric lap band: Secondary | ICD-10-CM | POA: Diagnosis not present

## 2017-04-16 DIAGNOSIS — F329 Major depressive disorder, single episode, unspecified: Secondary | ICD-10-CM | POA: Diagnosis not present

## 2017-04-16 DIAGNOSIS — F419 Anxiety disorder, unspecified: Secondary | ICD-10-CM | POA: Diagnosis not present

## 2017-04-16 DIAGNOSIS — F509 Eating disorder, unspecified: Secondary | ICD-10-CM | POA: Diagnosis not present

## 2017-05-14 DIAGNOSIS — F329 Major depressive disorder, single episode, unspecified: Secondary | ICD-10-CM | POA: Diagnosis not present

## 2017-05-14 DIAGNOSIS — F341 Dysthymic disorder: Secondary | ICD-10-CM | POA: Diagnosis not present

## 2017-05-14 DIAGNOSIS — F900 Attention-deficit hyperactivity disorder, predominantly inattentive type: Secondary | ICD-10-CM | POA: Diagnosis not present

## 2017-08-25 DIAGNOSIS — F900 Attention-deficit hyperactivity disorder, predominantly inattentive type: Secondary | ICD-10-CM | POA: Diagnosis not present

## 2017-08-25 DIAGNOSIS — F341 Dysthymic disorder: Secondary | ICD-10-CM | POA: Diagnosis not present

## 2017-08-25 DIAGNOSIS — F329 Major depressive disorder, single episode, unspecified: Secondary | ICD-10-CM | POA: Diagnosis not present

## 2017-10-13 DIAGNOSIS — G4733 Obstructive sleep apnea (adult) (pediatric): Secondary | ICD-10-CM | POA: Diagnosis not present

## 2017-11-12 DIAGNOSIS — H52223 Regular astigmatism, bilateral: Secondary | ICD-10-CM | POA: Diagnosis not present

## 2017-11-12 DIAGNOSIS — H5213 Myopia, bilateral: Secondary | ICD-10-CM | POA: Diagnosis not present

## 2017-11-19 DIAGNOSIS — K219 Gastro-esophageal reflux disease without esophagitis: Secondary | ICD-10-CM | POA: Diagnosis not present

## 2017-11-19 DIAGNOSIS — Z9884 Bariatric surgery status: Secondary | ICD-10-CM | POA: Diagnosis not present

## 2017-12-08 DIAGNOSIS — F329 Major depressive disorder, single episode, unspecified: Secondary | ICD-10-CM | POA: Diagnosis not present

## 2017-12-08 DIAGNOSIS — F341 Dysthymic disorder: Secondary | ICD-10-CM | POA: Diagnosis not present

## 2017-12-08 DIAGNOSIS — F900 Attention-deficit hyperactivity disorder, predominantly inattentive type: Secondary | ICD-10-CM | POA: Diagnosis not present

## 2017-12-29 DIAGNOSIS — M25511 Pain in right shoulder: Secondary | ICD-10-CM | POA: Diagnosis not present

## 2017-12-29 DIAGNOSIS — S42031A Displaced fracture of lateral end of right clavicle, initial encounter for closed fracture: Secondary | ICD-10-CM | POA: Diagnosis not present

## 2017-12-30 DIAGNOSIS — S42024A Nondisplaced fracture of shaft of right clavicle, initial encounter for closed fracture: Secondary | ICD-10-CM | POA: Diagnosis not present

## 2017-12-31 DIAGNOSIS — G8918 Other acute postprocedural pain: Secondary | ICD-10-CM | POA: Diagnosis not present

## 2017-12-31 DIAGNOSIS — S43101A Unspecified dislocation of right acromioclavicular joint, initial encounter: Secondary | ICD-10-CM | POA: Diagnosis not present

## 2017-12-31 DIAGNOSIS — S42031A Displaced fracture of lateral end of right clavicle, initial encounter for closed fracture: Secondary | ICD-10-CM | POA: Diagnosis not present

## 2017-12-31 DIAGNOSIS — Y9355 Activity, bike riding: Secondary | ICD-10-CM | POA: Diagnosis not present

## 2017-12-31 DIAGNOSIS — X58XXXA Exposure to other specified factors, initial encounter: Secondary | ICD-10-CM | POA: Diagnosis not present

## 2017-12-31 DIAGNOSIS — S42001A Fracture of unspecified part of right clavicle, initial encounter for closed fracture: Secondary | ICD-10-CM | POA: Diagnosis not present

## 2018-01-14 DIAGNOSIS — S42001D Fracture of unspecified part of right clavicle, subsequent encounter for fracture with routine healing: Secondary | ICD-10-CM | POA: Diagnosis not present

## 2018-02-15 DIAGNOSIS — S42001D Fracture of unspecified part of right clavicle, subsequent encounter for fracture with routine healing: Secondary | ICD-10-CM | POA: Diagnosis not present

## 2018-02-22 DIAGNOSIS — S42001D Fracture of unspecified part of right clavicle, subsequent encounter for fracture with routine healing: Secondary | ICD-10-CM | POA: Diagnosis not present

## 2018-02-26 DIAGNOSIS — M25611 Stiffness of right shoulder, not elsewhere classified: Secondary | ICD-10-CM | POA: Diagnosis not present

## 2018-02-26 DIAGNOSIS — S42031D Displaced fracture of lateral end of right clavicle, subsequent encounter for fracture with routine healing: Secondary | ICD-10-CM | POA: Diagnosis not present

## 2018-03-01 DIAGNOSIS — R531 Weakness: Secondary | ICD-10-CM | POA: Diagnosis not present

## 2018-03-01 DIAGNOSIS — M25611 Stiffness of right shoulder, not elsewhere classified: Secondary | ICD-10-CM | POA: Diagnosis not present

## 2018-03-01 DIAGNOSIS — S42031D Displaced fracture of lateral end of right clavicle, subsequent encounter for fracture with routine healing: Secondary | ICD-10-CM | POA: Diagnosis not present

## 2018-03-05 DIAGNOSIS — S42031D Displaced fracture of lateral end of right clavicle, subsequent encounter for fracture with routine healing: Secondary | ICD-10-CM | POA: Diagnosis not present

## 2018-03-05 DIAGNOSIS — M25611 Stiffness of right shoulder, not elsewhere classified: Secondary | ICD-10-CM | POA: Diagnosis not present

## 2018-03-12 DIAGNOSIS — S42031D Displaced fracture of lateral end of right clavicle, subsequent encounter for fracture with routine healing: Secondary | ICD-10-CM | POA: Diagnosis not present

## 2018-03-12 DIAGNOSIS — M25611 Stiffness of right shoulder, not elsewhere classified: Secondary | ICD-10-CM | POA: Diagnosis not present

## 2018-03-15 DIAGNOSIS — S42031D Displaced fracture of lateral end of right clavicle, subsequent encounter for fracture with routine healing: Secondary | ICD-10-CM | POA: Diagnosis not present

## 2018-03-15 DIAGNOSIS — M25611 Stiffness of right shoulder, not elsewhere classified: Secondary | ICD-10-CM | POA: Diagnosis not present

## 2018-03-22 DIAGNOSIS — S42031D Displaced fracture of lateral end of right clavicle, subsequent encounter for fracture with routine healing: Secondary | ICD-10-CM | POA: Diagnosis not present

## 2018-03-22 DIAGNOSIS — R531 Weakness: Secondary | ICD-10-CM | POA: Diagnosis not present

## 2018-03-22 DIAGNOSIS — M25611 Stiffness of right shoulder, not elsewhere classified: Secondary | ICD-10-CM | POA: Diagnosis not present

## 2018-03-30 DIAGNOSIS — M25611 Stiffness of right shoulder, not elsewhere classified: Secondary | ICD-10-CM | POA: Diagnosis not present

## 2018-03-30 DIAGNOSIS — S42031D Displaced fracture of lateral end of right clavicle, subsequent encounter for fracture with routine healing: Secondary | ICD-10-CM | POA: Diagnosis not present

## 2018-04-05 DIAGNOSIS — M25611 Stiffness of right shoulder, not elsewhere classified: Secondary | ICD-10-CM | POA: Diagnosis not present

## 2018-04-05 DIAGNOSIS — S42031D Displaced fracture of lateral end of right clavicle, subsequent encounter for fracture with routine healing: Secondary | ICD-10-CM | POA: Diagnosis not present

## 2018-04-05 DIAGNOSIS — R531 Weakness: Secondary | ICD-10-CM | POA: Diagnosis not present

## 2018-04-07 DIAGNOSIS — F329 Major depressive disorder, single episode, unspecified: Secondary | ICD-10-CM | POA: Diagnosis not present

## 2018-04-07 DIAGNOSIS — F900 Attention-deficit hyperactivity disorder, predominantly inattentive type: Secondary | ICD-10-CM | POA: Diagnosis not present

## 2018-04-07 DIAGNOSIS — F341 Dysthymic disorder: Secondary | ICD-10-CM | POA: Diagnosis not present

## 2018-04-13 DIAGNOSIS — R531 Weakness: Secondary | ICD-10-CM | POA: Diagnosis not present

## 2018-04-13 DIAGNOSIS — S42031D Displaced fracture of lateral end of right clavicle, subsequent encounter for fracture with routine healing: Secondary | ICD-10-CM | POA: Diagnosis not present

## 2018-04-13 DIAGNOSIS — M25611 Stiffness of right shoulder, not elsewhere classified: Secondary | ICD-10-CM | POA: Diagnosis not present

## 2018-04-21 DIAGNOSIS — S42031D Displaced fracture of lateral end of right clavicle, subsequent encounter for fracture with routine healing: Secondary | ICD-10-CM | POA: Diagnosis not present

## 2018-04-21 DIAGNOSIS — M25611 Stiffness of right shoulder, not elsewhere classified: Secondary | ICD-10-CM | POA: Diagnosis not present

## 2018-04-28 DIAGNOSIS — M25611 Stiffness of right shoulder, not elsewhere classified: Secondary | ICD-10-CM | POA: Diagnosis not present

## 2018-04-28 DIAGNOSIS — S42031D Displaced fracture of lateral end of right clavicle, subsequent encounter for fracture with routine healing: Secondary | ICD-10-CM | POA: Diagnosis not present

## 2018-05-05 DIAGNOSIS — M25611 Stiffness of right shoulder, not elsewhere classified: Secondary | ICD-10-CM | POA: Diagnosis not present

## 2018-05-05 DIAGNOSIS — S42031D Displaced fracture of lateral end of right clavicle, subsequent encounter for fracture with routine healing: Secondary | ICD-10-CM | POA: Diagnosis not present

## 2018-05-12 DIAGNOSIS — M25611 Stiffness of right shoulder, not elsewhere classified: Secondary | ICD-10-CM | POA: Diagnosis not present

## 2018-05-12 DIAGNOSIS — S42031D Displaced fracture of lateral end of right clavicle, subsequent encounter for fracture with routine healing: Secondary | ICD-10-CM | POA: Diagnosis not present

## 2018-05-20 DIAGNOSIS — M25611 Stiffness of right shoulder, not elsewhere classified: Secondary | ICD-10-CM | POA: Diagnosis not present

## 2018-05-20 DIAGNOSIS — S42031D Displaced fracture of lateral end of right clavicle, subsequent encounter for fracture with routine healing: Secondary | ICD-10-CM | POA: Diagnosis not present

## 2018-05-26 DIAGNOSIS — M25611 Stiffness of right shoulder, not elsewhere classified: Secondary | ICD-10-CM | POA: Diagnosis not present

## 2018-05-26 DIAGNOSIS — S42031D Displaced fracture of lateral end of right clavicle, subsequent encounter for fracture with routine healing: Secondary | ICD-10-CM | POA: Diagnosis not present

## 2018-06-01 DIAGNOSIS — S42031D Displaced fracture of lateral end of right clavicle, subsequent encounter for fracture with routine healing: Secondary | ICD-10-CM | POA: Diagnosis not present

## 2018-06-01 DIAGNOSIS — M25611 Stiffness of right shoulder, not elsewhere classified: Secondary | ICD-10-CM | POA: Diagnosis not present

## 2018-06-02 DIAGNOSIS — S42001D Fracture of unspecified part of right clavicle, subsequent encounter for fracture with routine healing: Secondary | ICD-10-CM | POA: Diagnosis not present

## 2018-07-08 DIAGNOSIS — F900 Attention-deficit hyperactivity disorder, predominantly inattentive type: Secondary | ICD-10-CM | POA: Diagnosis not present

## 2018-07-08 DIAGNOSIS — F329 Major depressive disorder, single episode, unspecified: Secondary | ICD-10-CM | POA: Diagnosis not present

## 2018-07-08 DIAGNOSIS — F341 Dysthymic disorder: Secondary | ICD-10-CM | POA: Diagnosis not present

## 2018-07-28 ENCOUNTER — Other Ambulatory Visit: Payer: Self-pay | Admitting: Orthopedic Surgery

## 2018-07-28 DIAGNOSIS — M25511 Pain in right shoulder: Secondary | ICD-10-CM

## 2018-07-28 DIAGNOSIS — S42001D Fracture of unspecified part of right clavicle, subsequent encounter for fracture with routine healing: Secondary | ICD-10-CM | POA: Diagnosis not present

## 2018-08-10 ENCOUNTER — Other Ambulatory Visit: Payer: Self-pay

## 2018-08-10 ENCOUNTER — Ambulatory Visit
Admission: RE | Admit: 2018-08-10 | Discharge: 2018-08-10 | Disposition: A | Discharge status: Home / Self Care | Payer: BLUE CROSS/BLUE SHIELD | Source: Ambulatory Visit | Attending: Orthopedic Surgery | Admitting: Orthopedic Surgery

## 2018-08-10 DIAGNOSIS — M25511 Pain in right shoulder: Secondary | ICD-10-CM | POA: Diagnosis not present

## 2018-08-10 MED ORDER — IOPAMIDOL (ISOVUE-M 200) INJECTION 41%
13.0000 mL | Freq: Once | INTRAMUSCULAR | Status: AC
  Administered 2018-08-10: 16:00:00 12 mL via INTRA_ARTICULAR

## 2018-08-13 DIAGNOSIS — S42001D Fracture of unspecified part of right clavicle, subsequent encounter for fracture with routine healing: Secondary | ICD-10-CM | POA: Diagnosis not present

## 2018-09-03 DIAGNOSIS — E663 Overweight: Secondary | ICD-10-CM | POA: Diagnosis not present

## 2018-09-03 DIAGNOSIS — G4733 Obstructive sleep apnea (adult) (pediatric): Secondary | ICD-10-CM | POA: Diagnosis not present

## 2018-09-03 DIAGNOSIS — E039 Hypothyroidism, unspecified: Secondary | ICD-10-CM | POA: Diagnosis not present

## 2018-09-03 DIAGNOSIS — R7309 Other abnormal glucose: Secondary | ICD-10-CM | POA: Diagnosis not present

## 2018-09-03 DIAGNOSIS — F329 Major depressive disorder, single episode, unspecified: Secondary | ICD-10-CM | POA: Diagnosis not present

## 2018-09-03 DIAGNOSIS — R5383 Other fatigue: Secondary | ICD-10-CM | POA: Diagnosis not present

## 2018-09-03 DIAGNOSIS — Z6826 Body mass index (BMI) 26.0-26.9, adult: Secondary | ICD-10-CM | POA: Diagnosis not present

## 2018-09-03 DIAGNOSIS — Z1389 Encounter for screening for other disorder: Secondary | ICD-10-CM | POA: Diagnosis not present

## 2018-09-03 DIAGNOSIS — Z0001 Encounter for general adult medical examination with abnormal findings: Secondary | ICD-10-CM | POA: Diagnosis not present

## 2018-09-03 DIAGNOSIS — E7849 Other hyperlipidemia: Secondary | ICD-10-CM | POA: Diagnosis not present

## 2018-09-03 DIAGNOSIS — E559 Vitamin D deficiency, unspecified: Secondary | ICD-10-CM | POA: Diagnosis not present

## 2018-11-04 DIAGNOSIS — F341 Dysthymic disorder: Secondary | ICD-10-CM | POA: Diagnosis not present

## 2018-11-04 DIAGNOSIS — F329 Major depressive disorder, single episode, unspecified: Secondary | ICD-10-CM | POA: Diagnosis not present

## 2018-11-04 DIAGNOSIS — F900 Attention-deficit hyperactivity disorder, predominantly inattentive type: Secondary | ICD-10-CM | POA: Diagnosis not present

## 2019-02-24 DIAGNOSIS — F341 Dysthymic disorder: Secondary | ICD-10-CM | POA: Diagnosis not present

## 2019-02-24 DIAGNOSIS — F329 Major depressive disorder, single episode, unspecified: Secondary | ICD-10-CM | POA: Diagnosis not present

## 2019-02-24 DIAGNOSIS — F900 Attention-deficit hyperactivity disorder, predominantly inattentive type: Secondary | ICD-10-CM | POA: Diagnosis not present

## 2019-06-09 DIAGNOSIS — Z23 Encounter for immunization: Secondary | ICD-10-CM | POA: Diagnosis not present

## 2019-06-23 DIAGNOSIS — F329 Major depressive disorder, single episode, unspecified: Secondary | ICD-10-CM | POA: Diagnosis not present

## 2019-06-23 DIAGNOSIS — F341 Dysthymic disorder: Secondary | ICD-10-CM | POA: Diagnosis not present

## 2019-06-23 DIAGNOSIS — F900 Attention-deficit hyperactivity disorder, predominantly inattentive type: Secondary | ICD-10-CM | POA: Diagnosis not present

## 2019-07-02 DIAGNOSIS — Z23 Encounter for immunization: Secondary | ICD-10-CM | POA: Diagnosis not present

## 2019-10-13 DIAGNOSIS — F329 Major depressive disorder, single episode, unspecified: Secondary | ICD-10-CM | POA: Diagnosis not present

## 2019-10-13 DIAGNOSIS — F341 Dysthymic disorder: Secondary | ICD-10-CM | POA: Diagnosis not present

## 2019-10-13 DIAGNOSIS — F900 Attention-deficit hyperactivity disorder, predominantly inattentive type: Secondary | ICD-10-CM | POA: Diagnosis not present

## 2019-10-24 DIAGNOSIS — F329 Major depressive disorder, single episode, unspecified: Secondary | ICD-10-CM | POA: Diagnosis not present

## 2019-10-24 DIAGNOSIS — F900 Attention-deficit hyperactivity disorder, predominantly inattentive type: Secondary | ICD-10-CM | POA: Diagnosis not present

## 2019-10-24 DIAGNOSIS — F341 Dysthymic disorder: Secondary | ICD-10-CM | POA: Diagnosis not present

## 2019-11-07 DIAGNOSIS — F329 Major depressive disorder, single episode, unspecified: Secondary | ICD-10-CM | POA: Diagnosis not present

## 2019-11-07 DIAGNOSIS — F900 Attention-deficit hyperactivity disorder, predominantly inattentive type: Secondary | ICD-10-CM | POA: Diagnosis not present

## 2019-11-07 DIAGNOSIS — F341 Dysthymic disorder: Secondary | ICD-10-CM | POA: Diagnosis not present

## 2019-11-22 DIAGNOSIS — F329 Major depressive disorder, single episode, unspecified: Secondary | ICD-10-CM | POA: Diagnosis not present

## 2019-11-22 DIAGNOSIS — F341 Dysthymic disorder: Secondary | ICD-10-CM | POA: Diagnosis not present

## 2019-11-22 DIAGNOSIS — F900 Attention-deficit hyperactivity disorder, predominantly inattentive type: Secondary | ICD-10-CM | POA: Diagnosis not present

## 2019-12-06 DIAGNOSIS — F341 Dysthymic disorder: Secondary | ICD-10-CM | POA: Diagnosis not present

## 2019-12-06 DIAGNOSIS — F900 Attention-deficit hyperactivity disorder, predominantly inattentive type: Secondary | ICD-10-CM | POA: Diagnosis not present

## 2019-12-06 DIAGNOSIS — F329 Major depressive disorder, single episode, unspecified: Secondary | ICD-10-CM | POA: Diagnosis not present

## 2020-02-17 ENCOUNTER — Other Ambulatory Visit: Payer: Self-pay

## 2020-02-17 ENCOUNTER — Ambulatory Visit (AMBULATORY_SURGERY_CENTER): Payer: Self-pay | Admitting: *Deleted

## 2020-02-17 VITALS — Ht 71.0 in | Wt 200.0 lb

## 2020-02-17 DIAGNOSIS — Z86010 Personal history of colonic polyps: Secondary | ICD-10-CM

## 2020-02-17 DIAGNOSIS — Z800 Family history of malignant neoplasm of digestive organs: Secondary | ICD-10-CM

## 2020-02-17 NOTE — Progress Notes (Signed)

## 2020-02-20 ENCOUNTER — Encounter: Payer: Self-pay | Admitting: Gastroenterology

## 2020-03-02 ENCOUNTER — Encounter: Payer: Self-pay | Admitting: Gastroenterology

## 2020-03-02 ENCOUNTER — Other Ambulatory Visit: Payer: Self-pay

## 2020-03-02 ENCOUNTER — Ambulatory Visit (AMBULATORY_SURGERY_CENTER): Payer: BLUE CROSS/BLUE SHIELD | Admitting: Gastroenterology

## 2020-03-02 VITALS — BP 120/90 | HR 63 | Temp 96.9°F | Resp 13 | Ht 71.0 in | Wt 200.0 lb

## 2020-03-02 DIAGNOSIS — D122 Benign neoplasm of ascending colon: Secondary | ICD-10-CM | POA: Diagnosis not present

## 2020-03-02 DIAGNOSIS — Z86010 Personal history of colonic polyps: Secondary | ICD-10-CM

## 2020-03-02 DIAGNOSIS — D120 Benign neoplasm of cecum: Secondary | ICD-10-CM

## 2020-03-02 DIAGNOSIS — Z1211 Encounter for screening for malignant neoplasm of colon: Secondary | ICD-10-CM | POA: Diagnosis not present

## 2020-03-02 MED ORDER — FLEET ENEMA 7-19 GM/118ML RE ENEM
1.0000 | ENEMA | Freq: Once | RECTAL | Status: AC
  Administered 2020-03-02: 08:00:00 1 via RECTAL

## 2020-03-02 MED ORDER — SODIUM CHLORIDE 0.9 % IV SOLN: 500.0000 mL | Freq: Once | INTRAVENOUS | Status: DC

## 2020-03-02 NOTE — Progress Notes (Signed)
Called to room to assist during endoscopic procedure.  Patient ID and intended procedure confirmed with present staff. Received instructions for my participation in the procedure from the performing physician.  

## 2020-03-02 NOTE — Progress Notes (Signed)
To PACU, VSS. Report to [email or web address] °

## 2020-03-02 NOTE — Patient Instructions (Signed)
Handouts provided:  Diverticulosis and Polyps  YOU HAD AN ENDOSCOPIC PROCEDURE TODAY AT [facility]:   Refer to the procedure report that was given to you for any specific questions about what was found during the examination.  If the procedure report does not answer your questions, please call your gastroenterologist to clarify.  If you requested that your care partner not be given the details of your procedure findings, then the procedure report has been included in a sealed envelope for you to review at your convenience later.  YOU SHOULD EXPECT: Some feelings of bloating in the abdomen. Passage of more gas than usual.  Walking can help get rid of the air that was put into your GI tract during the procedure and reduce the bloating. If you had a lower endoscopy (such as a colonoscopy or flexible sigmoidoscopy) you may notice spotting of blood in your stool or on the toilet paper. If you underwent a bowel prep for your procedure, you may not have a normal bowel movement for a few days.  Please Note:  You might notice some irritation and congestion in your nose or some drainage.  This is from the oxygen used during your procedure.  There is no need for concern and it should clear up in a day or so.  SYMPTOMS TO REPORT IMMEDIATELY:   Following lower endoscopy (colonoscopy or flexible sigmoidoscopy):  Excessive amounts of blood in the stool  Significant tenderness or worsening of abdominal pains  Swelling of the abdomen that is new, acute  Fever of 100F or higher  For urgent or emergent issues, a gastroenterologist can be reached at any hour by calling [phone]. Do not use MyChart messaging for urgent concerns.    DIET:  We do recommend a small meal at first, but then you may proceed to your regular diet.  Drink plenty of fluids but you should avoid alcoholic beverages for 24 hours.  ACTIVITY:  You should plan to take it easy for the rest of today and you should NOT DRIVE  or use heavy machinery until tomorrow (because of the sedation medicines used during the test).    FOLLOW UP: Our staff will call the number listed on your records 48-72 hours following your procedure to check on you and address any questions or concerns that you may have regarding the information given to you following your procedure. If we do not reach you, we will leave a message.  We will attempt to reach you two times.  During this call, we will ask if you have developed any symptoms of COVID 19. If you develop any symptoms (ie: fever, flu-like symptoms, shortness of breath, cough etc.) before then, please call [phone].  If you test positive for Covid 19 in the 2 weeks post procedure, please call and report this information to [place].    If any biopsies were taken you will be contacted by phone or by letter within the next 1-3 weeks.  Please call us at [phone] if you have not heard about the biopsies in 3 weeks.    SIGNATURES/CONFIDENTIALITY: You and/or your care partner have signed paperwork which will be entered into your electronic medical record.  These signatures attest to the fact that that the information above on your After Visit Summary has been reviewed and is understood.  Full responsibility of the confidentiality of this discharge information lies with you and/or your care-partner.

## 2020-03-02 NOTE — Progress Notes (Signed)
Pt's states no medical or surgical changes since previsit or office visit.  Vs in adm by Colletta [place]    Pt reported cloudy brown stool last BM ,Dr Ardis Hughs notified and fleets enema x1 ordered and given to pt with instructions how to administer in bathroom

## 2020-03-02 NOTE — Op Note (Signed)
[facility] Patient Name: Jeffrey Moody Procedure Date: 03/02/2020 7:44 AM MRN: [id number] Endoscopist: Milus Banister , MD Age: 49 Referring MD:  Date of Birth: [birth year] Gender: Male Account #: [id number] Procedure:                Colonoscopy Indications:              High risk colon cancer surveillance: Personal                            history of colonic polyps and FH of colon cancer                            (father in his 4s, also other second degree                            relatives with CRC). Colonoscopy 2018 Dr. Carlean Purl                            removed 3 adenomatous polyps, one was 1.5cm Medicines:                Monitored Anesthesia Care Procedure:                Pre-Anesthesia Assessment:                           - Prior to the procedure, a History and Physical                            was performed, and patient medications and                            allergies were reviewed. The patient's tolerance of                            previous anesthesia was also reviewed. The risks                            and benefits of the procedure and the sedation                            options and risks were discussed with the patient.                            All questions were answered, and informed consent                            was obtained. Prior Anticoagulants: The patient has                            taken no previous anticoagulant or antiplatelet                            agents. ASA Grade Assessment: II - A patient with  mild systemic disease. After reviewing the risks                            and benefits, the patient was deemed in                            satisfactory condition to undergo the procedure.                           After obtaining informed consent, the colonoscope                            was passed under direct vision. Throughout the                            procedure, the patient's blood  pressure, pulse, and                            oxygen saturations were monitored continuously. The                            Olympus [id number]) [id number] was introduced                            through the anus and advanced to the the cecum,                            identified by appendiceal orifice and ileocecal                            valve. The colonoscopy was performed without                            difficulty. The patient tolerated the procedure                            well. The quality of the bowel preparation was                            good. The ileocecal valve, appendiceal orifice, and                            rectum were photographed. Scope In: 7:59:52 AM Scope Out: 8:21:22 AM Scope Withdrawal Time: 0 hours 15 minutes 26 seconds  Total Procedure Duration: 0 hours 21 minutes 30 seconds  Findings:                 A 1 mm polyp was found in the cecum. The polyp was                            sessile. The polyp was removed with a cold snare.                            Resection and retrieval were complete.  A 4 mm polyp was found in the ascending colon. The                            polyp was sessile. The polyp was removed with a                            cold snare. Resection and retrieval were complete.                           Multiple small and large-mouthed diverticula were                            found in the entire colon.                           The exam was otherwise without abnormality. Complications:            No immediate complications. Estimated blood loss:                            None. Estimated Blood Loss:     Estimated blood loss: none. Impression:               - One 1 mm polyp in the cecum, removed with a cold                            snare. Resected and retrieved.                           - One 4 mm polyp in the ascending colon, removed                            with a cold snare. Resected and  retrieved.                           - Diverticulosis in the entire examined colon.                           - The examination was otherwise normal. Recommendation:           - Patient has a contact number available for                            emergencies. The signs and symptoms of potential                            delayed complications were discussed with the                            patient. Return to normal activities tomorrow.                            Written discharge instructions were provided to the  patient.                           - Resume previous diet.                           - Continue present medications.                           - Await pathology results. Milus Banister, MD 03/02/2020 8:24:42 AM This report has been signed electronically.

## 2020-03-06 ENCOUNTER — Telehealth: Payer: Self-pay | Admitting: *Deleted

## 2020-03-06 ENCOUNTER — Telehealth: Payer: Self-pay

## 2020-03-06 DIAGNOSIS — F341 Dysthymic disorder: Secondary | ICD-10-CM | POA: Diagnosis not present

## 2020-03-06 DIAGNOSIS — F329 Major depressive disorder, single episode, unspecified: Secondary | ICD-10-CM | POA: Diagnosis not present

## 2020-03-06 DIAGNOSIS — F900 Attention-deficit hyperactivity disorder, predominantly inattentive type: Secondary | ICD-10-CM | POA: Diagnosis not present

## 2020-03-06 NOTE — Telephone Encounter (Signed)
Left message on f/u call 

## 2020-03-06 NOTE — Telephone Encounter (Signed)
  Follow up Call-  Call back number 03/02/2020  Post procedure Call Back phone  # [phone]  Permission to leave phone message Yes  Some recent data might be hidden     Patient questions:  Do you have a fever, pain , or abdominal swelling? No. Pain Score  0 *  Have you tolerated food without any problems? Yes.    Have you been able to return to your normal activities? Yes.    Do you have any questions about your discharge instructions: Diet   No. Medications  No. Follow up visit  No.  Do you have questions or concerns about your Care? No.  Actions: * If pain score is 4 or above: No action needed, pain <4.  1. Have you developed a fever since your procedure? no  2.   Have you had an respiratory symptoms (SOB or cough) since your procedure? no  3.   Have you tested positive for COVID 19 since your procedure no  4.   Have you had any family members/close contacts diagnosed with the COVID 19 since your procedure?  no   If yes to any of these questions please route to Joylene John, RN and Joella Prince, RN

## 2020-03-13 ENCOUNTER — Encounter: Payer: Self-pay | Admitting: Gastroenterology

## 2020-04-03 DIAGNOSIS — F900 Attention-deficit hyperactivity disorder, predominantly inattentive type: Secondary | ICD-10-CM | POA: Diagnosis not present

## 2020-04-03 DIAGNOSIS — F339 Major depressive disorder, recurrent, unspecified: Secondary | ICD-10-CM | POA: Diagnosis not present

## 2020-05-01 DIAGNOSIS — F900 Attention-deficit hyperactivity disorder, predominantly inattentive type: Secondary | ICD-10-CM | POA: Diagnosis not present

## 2020-05-01 DIAGNOSIS — F329 Major depressive disorder, single episode, unspecified: Secondary | ICD-10-CM | POA: Diagnosis not present

## 2020-05-01 DIAGNOSIS — F341 Dysthymic disorder: Secondary | ICD-10-CM | POA: Diagnosis not present

## 2020-06-28 DIAGNOSIS — F900 Attention-deficit hyperactivity disorder, predominantly inattentive type: Secondary | ICD-10-CM | POA: Diagnosis not present

## 2020-06-28 DIAGNOSIS — F341 Dysthymic disorder: Secondary | ICD-10-CM | POA: Diagnosis not present

## 2020-06-28 DIAGNOSIS — F329 Major depressive disorder, single episode, unspecified: Secondary | ICD-10-CM | POA: Diagnosis not present

## 2020-07-03 DIAGNOSIS — F900 Attention-deficit hyperactivity disorder, predominantly inattentive type: Secondary | ICD-10-CM | POA: Diagnosis not present

## 2020-07-03 DIAGNOSIS — F341 Dysthymic disorder: Secondary | ICD-10-CM | POA: Diagnosis not present

## 2020-07-03 DIAGNOSIS — F329 Major depressive disorder, single episode, unspecified: Secondary | ICD-10-CM | POA: Diagnosis not present

## 2020-07-30 DIAGNOSIS — F341 Dysthymic disorder: Secondary | ICD-10-CM | POA: Diagnosis not present

## 2020-07-30 DIAGNOSIS — F329 Major depressive disorder, single episode, unspecified: Secondary | ICD-10-CM | POA: Diagnosis not present

## 2020-07-30 DIAGNOSIS — F900 Attention-deficit hyperactivity disorder, predominantly inattentive type: Secondary | ICD-10-CM | POA: Diagnosis not present

## 2020-08-28 DIAGNOSIS — F341 Dysthymic disorder: Secondary | ICD-10-CM | POA: Diagnosis not present

## 2020-08-28 DIAGNOSIS — F329 Major depressive disorder, single episode, unspecified: Secondary | ICD-10-CM | POA: Diagnosis not present

## 2020-08-28 DIAGNOSIS — F900 Attention-deficit hyperactivity disorder, predominantly inattentive type: Secondary | ICD-10-CM | POA: Diagnosis not present

## 2020-09-16 IMAGING — XA FLUORO GUIDED NEEDLE PLACEMENT
2 series · 2 of 2 positions shown · IV contrast (isovue)
Comparison: none

CLINICAL DATA: Right shoulder pain. History of right clavicle
fracture.

EXAM:
RIGHT SHOULDER INJECTION UNDER FLUOROSCOPY
TECHNIQUE: An appropriate skin entrance site was determined. The site was
marked, prepped with Betadine, draped in the usual sterile fashion,
and infiltrated locally with 1% lidocaine. A 22 gauge spinal needle
was advanced to the superomedial margin of the humeral head under
intermittent fluoroscopy. 1 mL of 1% lidocaine injected easily. A
mixture of 15 mL of Isovue-M 200 and 15 mL of sterile saline was
then used to opacify the right shoulder capsule. 12 mL of this
mixture were injected. No immediate complication.
FLUOROSCOPY TIME:  Fluoroscopy Time:  1 second
Radiation Exposure Index (if provided by the fluoroscopic device):
10.44 microGray*m^2
Number of Acquired Spot Images: 0

[Series 1: ortho standard · 1 of 1 slices shown (1 of 2)]
[im 1/1]
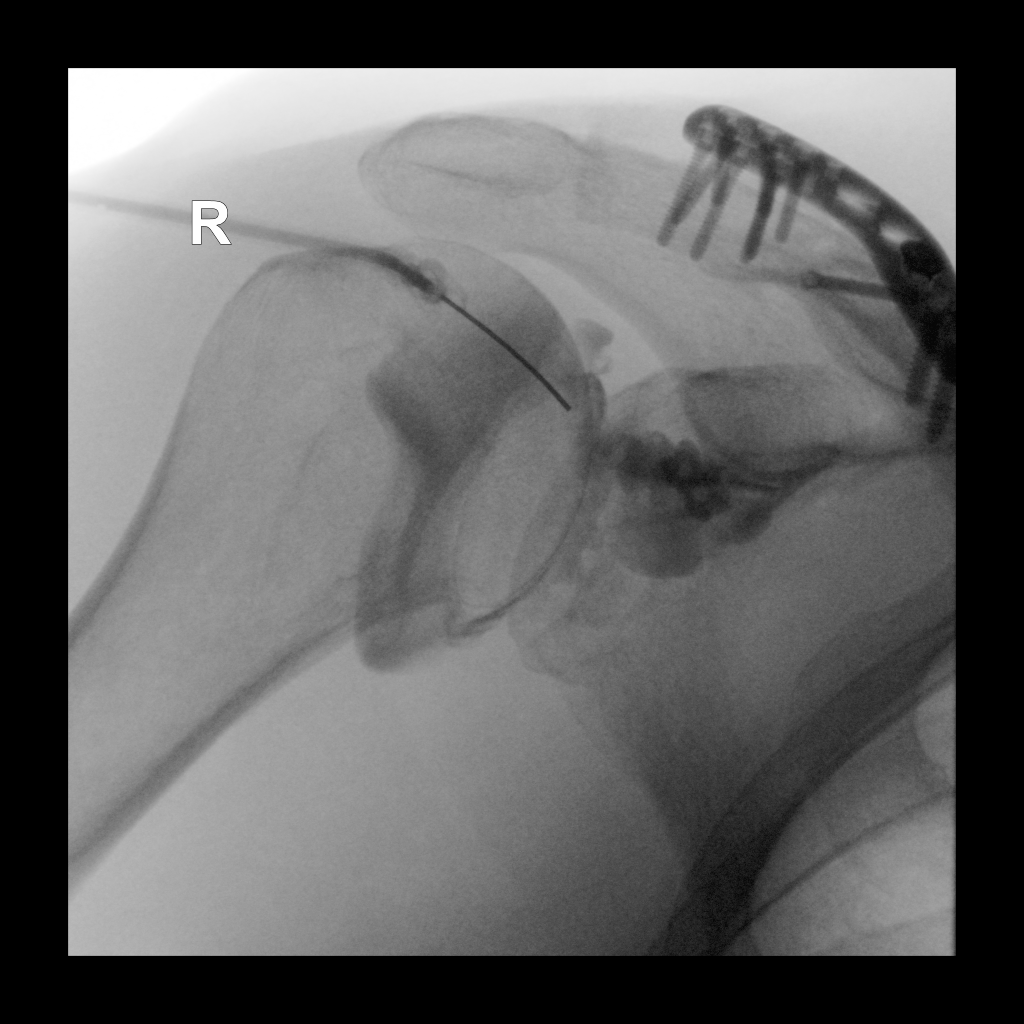

[Series 2: ortho standard · 1 of 1 slices shown (2 of 2)]
[im 1/1]
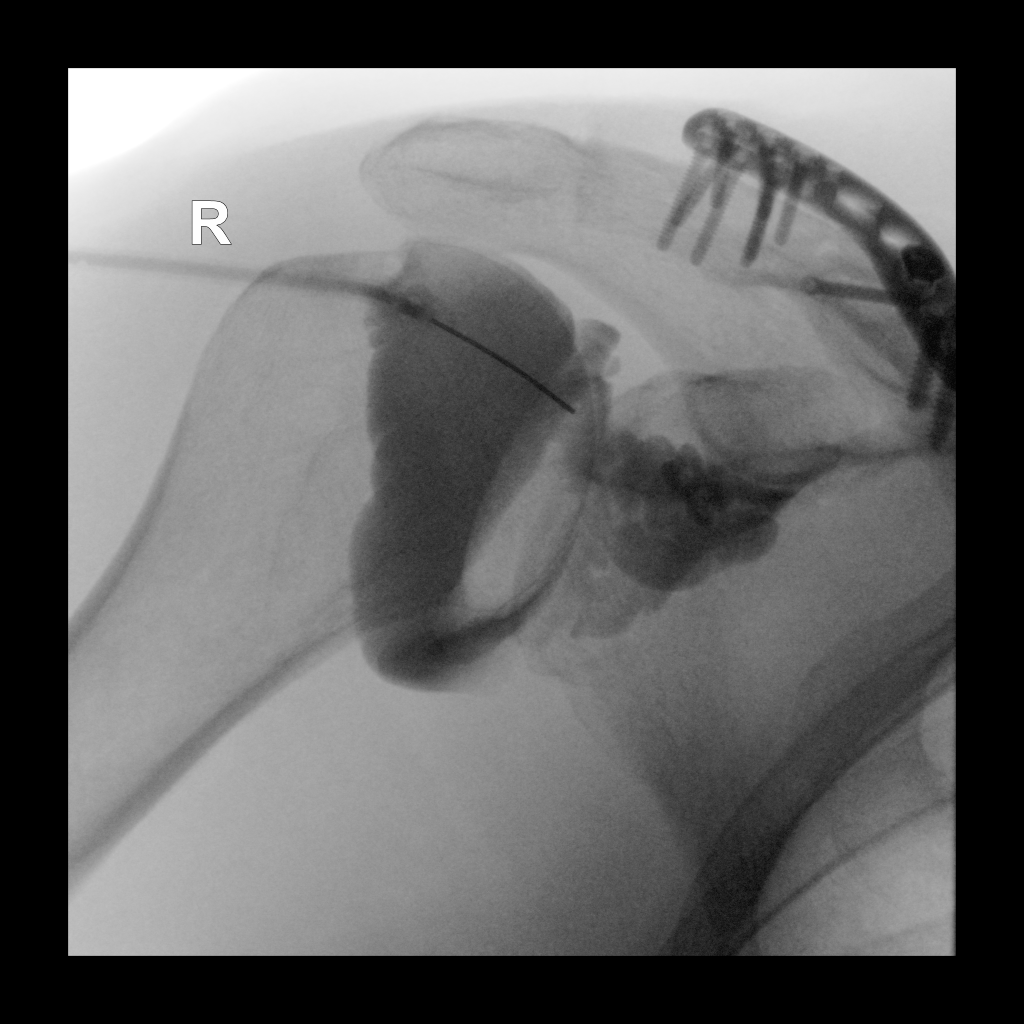

[2 of 2 positions shown; findings below may reference images not displayed]

IMPRESSION: Technically successful right shoulder injection for CT arthrogram.

## 2020-10-03 DIAGNOSIS — F341 Dysthymic disorder: Secondary | ICD-10-CM | POA: Diagnosis not present

## 2020-10-03 DIAGNOSIS — F900 Attention-deficit hyperactivity disorder, predominantly inattentive type: Secondary | ICD-10-CM | POA: Diagnosis not present

## 2020-10-03 DIAGNOSIS — F329 Major depressive disorder, single episode, unspecified: Secondary | ICD-10-CM | POA: Diagnosis not present

## 2020-10-25 DIAGNOSIS — E782 Mixed hyperlipidemia: Secondary | ICD-10-CM | POA: Diagnosis not present

## 2020-10-25 DIAGNOSIS — Z1389 Encounter for screening for other disorder: Secondary | ICD-10-CM | POA: Diagnosis not present

## 2020-10-25 DIAGNOSIS — Z1331 Encounter for screening for depression: Secondary | ICD-10-CM | POA: Diagnosis not present

## 2020-10-25 DIAGNOSIS — Z0000 Encounter for general adult medical examination without abnormal findings: Secondary | ICD-10-CM | POA: Diagnosis not present

## 2020-10-25 DIAGNOSIS — F329 Major depressive disorder, single episode, unspecified: Secondary | ICD-10-CM | POA: Diagnosis not present

## 2020-10-25 DIAGNOSIS — R7309 Other abnormal glucose: Secondary | ICD-10-CM | POA: Diagnosis not present

## 2020-10-25 DIAGNOSIS — R5383 Other fatigue: Secondary | ICD-10-CM | POA: Diagnosis not present

## 2020-10-25 DIAGNOSIS — E663 Overweight: Secondary | ICD-10-CM | POA: Diagnosis not present

## 2020-10-25 DIAGNOSIS — Z0001 Encounter for general adult medical examination with abnormal findings: Secondary | ICD-10-CM | POA: Diagnosis not present

## 2020-10-25 DIAGNOSIS — J309 Allergic rhinitis, unspecified: Secondary | ICD-10-CM | POA: Diagnosis not present

## 2020-10-25 DIAGNOSIS — E039 Hypothyroidism, unspecified: Secondary | ICD-10-CM | POA: Diagnosis not present

## 2020-10-25 DIAGNOSIS — Z6828 Body mass index (BMI) 28.0-28.9, adult: Secondary | ICD-10-CM | POA: Diagnosis not present

## 2020-10-25 DIAGNOSIS — Z9884 Bariatric surgery status: Secondary | ICD-10-CM | POA: Diagnosis not present

## 2020-10-25 DIAGNOSIS — E559 Vitamin D deficiency, unspecified: Secondary | ICD-10-CM | POA: Diagnosis not present

## 2020-11-06 DIAGNOSIS — F329 Major depressive disorder, single episode, unspecified: Secondary | ICD-10-CM | POA: Diagnosis not present

## 2020-11-06 DIAGNOSIS — F900 Attention-deficit hyperactivity disorder, predominantly inattentive type: Secondary | ICD-10-CM | POA: Diagnosis not present

## 2020-11-06 DIAGNOSIS — F341 Dysthymic disorder: Secondary | ICD-10-CM | POA: Diagnosis not present

## 2020-11-07 DIAGNOSIS — F900 Attention-deficit hyperactivity disorder, predominantly inattentive type: Secondary | ICD-10-CM | POA: Diagnosis not present

## 2020-11-07 DIAGNOSIS — F341 Dysthymic disorder: Secondary | ICD-10-CM | POA: Diagnosis not present

## 2020-11-07 DIAGNOSIS — F329 Major depressive disorder, single episode, unspecified: Secondary | ICD-10-CM | POA: Diagnosis not present

## 2020-11-29 DIAGNOSIS — G4733 Obstructive sleep apnea (adult) (pediatric): Secondary | ICD-10-CM | POA: Diagnosis not present

## 2020-12-05 DIAGNOSIS — F329 Major depressive disorder, single episode, unspecified: Secondary | ICD-10-CM | POA: Diagnosis not present

## 2020-12-05 DIAGNOSIS — F900 Attention-deficit hyperactivity disorder, predominantly inattentive type: Secondary | ICD-10-CM | POA: Diagnosis not present

## 2020-12-05 DIAGNOSIS — F341 Dysthymic disorder: Secondary | ICD-10-CM | POA: Diagnosis not present

## 2021-01-09 DIAGNOSIS — F341 Dysthymic disorder: Secondary | ICD-10-CM | POA: Diagnosis not present

## 2021-01-09 DIAGNOSIS — F900 Attention-deficit hyperactivity disorder, predominantly inattentive type: Secondary | ICD-10-CM | POA: Diagnosis not present

## 2021-01-09 DIAGNOSIS — F329 Major depressive disorder, single episode, unspecified: Secondary | ICD-10-CM | POA: Diagnosis not present

## 2021-01-30 DIAGNOSIS — F329 Major depressive disorder, single episode, unspecified: Secondary | ICD-10-CM | POA: Diagnosis not present

## 2021-01-30 DIAGNOSIS — F341 Dysthymic disorder: Secondary | ICD-10-CM | POA: Diagnosis not present

## 2021-01-30 DIAGNOSIS — F900 Attention-deficit hyperactivity disorder, predominantly inattentive type: Secondary | ICD-10-CM | POA: Diagnosis not present

## 2021-02-14 DIAGNOSIS — F900 Attention-deficit hyperactivity disorder, predominantly inattentive type: Secondary | ICD-10-CM | POA: Diagnosis not present

## 2021-02-14 DIAGNOSIS — F341 Dysthymic disorder: Secondary | ICD-10-CM | POA: Diagnosis not present

## 2021-02-14 DIAGNOSIS — F329 Major depressive disorder, single episode, unspecified: Secondary | ICD-10-CM | POA: Diagnosis not present

## 2021-03-20 DIAGNOSIS — F341 Dysthymic disorder: Secondary | ICD-10-CM | POA: Diagnosis not present

## 2021-03-20 DIAGNOSIS — F900 Attention-deficit hyperactivity disorder, predominantly inattentive type: Secondary | ICD-10-CM | POA: Diagnosis not present

## 2021-03-20 DIAGNOSIS — F329 Major depressive disorder, single episode, unspecified: Secondary | ICD-10-CM | POA: Diagnosis not present

## 2021-04-17 DIAGNOSIS — F900 Attention-deficit hyperactivity disorder, predominantly inattentive type: Secondary | ICD-10-CM | POA: Diagnosis not present

## 2021-04-17 DIAGNOSIS — F341 Dysthymic disorder: Secondary | ICD-10-CM | POA: Diagnosis not present

## 2021-04-17 DIAGNOSIS — F329 Major depressive disorder, single episode, unspecified: Secondary | ICD-10-CM | POA: Diagnosis not present

## 2021-05-06 DIAGNOSIS — F900 Attention-deficit hyperactivity disorder, predominantly inattentive type: Secondary | ICD-10-CM | POA: Diagnosis not present

## 2021-05-06 DIAGNOSIS — F341 Dysthymic disorder: Secondary | ICD-10-CM | POA: Diagnosis not present

## 2021-05-06 DIAGNOSIS — F329 Major depressive disorder, single episode, unspecified: Secondary | ICD-10-CM | POA: Diagnosis not present

## 2021-06-05 DIAGNOSIS — F329 Major depressive disorder, single episode, unspecified: Secondary | ICD-10-CM | POA: Diagnosis not present

## 2021-06-05 DIAGNOSIS — F900 Attention-deficit hyperactivity disorder, predominantly inattentive type: Secondary | ICD-10-CM | POA: Diagnosis not present

## 2021-06-05 DIAGNOSIS — F341 Dysthymic disorder: Secondary | ICD-10-CM | POA: Diagnosis not present

## 2021-07-02 DIAGNOSIS — F900 Attention-deficit hyperactivity disorder, predominantly inattentive type: Secondary | ICD-10-CM | POA: Diagnosis not present

## 2021-07-02 DIAGNOSIS — F341 Dysthymic disorder: Secondary | ICD-10-CM | POA: Diagnosis not present

## 2021-07-02 DIAGNOSIS — F329 Major depressive disorder, single episode, unspecified: Secondary | ICD-10-CM | POA: Diagnosis not present

## 2021-07-17 DIAGNOSIS — F341 Dysthymic disorder: Secondary | ICD-10-CM | POA: Diagnosis not present

## 2021-07-17 DIAGNOSIS — F900 Attention-deficit hyperactivity disorder, predominantly inattentive type: Secondary | ICD-10-CM | POA: Diagnosis not present

## 2021-07-17 DIAGNOSIS — F329 Major depressive disorder, single episode, unspecified: Secondary | ICD-10-CM | POA: Diagnosis not present

## 2021-07-30 DIAGNOSIS — F900 Attention-deficit hyperactivity disorder, predominantly inattentive type: Secondary | ICD-10-CM | POA: Diagnosis not present

## 2021-07-30 DIAGNOSIS — F341 Dysthymic disorder: Secondary | ICD-10-CM | POA: Diagnosis not present

## 2021-07-30 DIAGNOSIS — F329 Major depressive disorder, single episode, unspecified: Secondary | ICD-10-CM | POA: Diagnosis not present

## 2021-09-03 DIAGNOSIS — F329 Major depressive disorder, single episode, unspecified: Secondary | ICD-10-CM | POA: Diagnosis not present

## 2021-09-03 DIAGNOSIS — F341 Dysthymic disorder: Secondary | ICD-10-CM | POA: Diagnosis not present

## 2021-09-03 DIAGNOSIS — F900 Attention-deficit hyperactivity disorder, predominantly inattentive type: Secondary | ICD-10-CM | POA: Diagnosis not present

## 2021-10-08 DIAGNOSIS — F329 Major depressive disorder, single episode, unspecified: Secondary | ICD-10-CM | POA: Diagnosis not present

## 2021-10-08 DIAGNOSIS — F341 Dysthymic disorder: Secondary | ICD-10-CM | POA: Diagnosis not present

## 2021-10-08 DIAGNOSIS — F900 Attention-deficit hyperactivity disorder, predominantly inattentive type: Secondary | ICD-10-CM | POA: Diagnosis not present

## 2021-11-20 DIAGNOSIS — F900 Attention-deficit hyperactivity disorder, predominantly inattentive type: Secondary | ICD-10-CM | POA: Diagnosis not present

## 2021-11-20 DIAGNOSIS — F341 Dysthymic disorder: Secondary | ICD-10-CM | POA: Diagnosis not present

## 2021-11-20 DIAGNOSIS — F329 Major depressive disorder, single episode, unspecified: Secondary | ICD-10-CM | POA: Diagnosis not present

## 2021-12-18 DIAGNOSIS — F329 Major depressive disorder, single episode, unspecified: Secondary | ICD-10-CM | POA: Diagnosis not present

## 2021-12-18 DIAGNOSIS — F900 Attention-deficit hyperactivity disorder, predominantly inattentive type: Secondary | ICD-10-CM | POA: Diagnosis not present

## 2021-12-18 DIAGNOSIS — F341 Dysthymic disorder: Secondary | ICD-10-CM | POA: Diagnosis not present

## 2022-01-14 DIAGNOSIS — F341 Dysthymic disorder: Secondary | ICD-10-CM | POA: Diagnosis not present

## 2022-01-14 DIAGNOSIS — F329 Major depressive disorder, single episode, unspecified: Secondary | ICD-10-CM | POA: Diagnosis not present

## 2022-01-14 DIAGNOSIS — F900 Attention-deficit hyperactivity disorder, predominantly inattentive type: Secondary | ICD-10-CM | POA: Diagnosis not present

## 2022-01-16 DIAGNOSIS — E162 Hypoglycemia, unspecified: Secondary | ICD-10-CM | POA: Diagnosis not present

## 2022-01-16 DIAGNOSIS — Z789 Other specified health status: Secondary | ICD-10-CM | POA: Diagnosis not present

## 2022-01-16 DIAGNOSIS — E039 Hypothyroidism, unspecified: Secondary | ICD-10-CM | POA: Diagnosis not present

## 2022-01-16 DIAGNOSIS — Z6828 Body mass index (BMI) 28.0-28.9, adult: Secondary | ICD-10-CM | POA: Diagnosis not present

## 2022-01-16 DIAGNOSIS — E782 Mixed hyperlipidemia: Secondary | ICD-10-CM | POA: Diagnosis not present

## 2022-01-16 DIAGNOSIS — E559 Vitamin D deficiency, unspecified: Secondary | ICD-10-CM | POA: Diagnosis not present

## 2022-01-16 DIAGNOSIS — K5730 Diverticulosis of large intestine without perforation or abscess without bleeding: Secondary | ICD-10-CM | POA: Diagnosis not present

## 2022-01-16 DIAGNOSIS — G4733 Obstructive sleep apnea (adult) (pediatric): Secondary | ICD-10-CM | POA: Diagnosis not present

## 2022-01-16 DIAGNOSIS — Z0001 Encounter for general adult medical examination with abnormal findings: Secondary | ICD-10-CM | POA: Diagnosis not present

## 2022-01-16 DIAGNOSIS — E663 Overweight: Secondary | ICD-10-CM | POA: Diagnosis not present

## 2022-01-16 DIAGNOSIS — Z1331 Encounter for screening for depression: Secondary | ICD-10-CM | POA: Diagnosis not present

## 2022-01-16 DIAGNOSIS — E7849 Other hyperlipidemia: Secondary | ICD-10-CM | POA: Diagnosis not present

## 2022-02-18 DIAGNOSIS — F341 Dysthymic disorder: Secondary | ICD-10-CM | POA: Diagnosis not present

## 2022-02-18 DIAGNOSIS — F900 Attention-deficit hyperactivity disorder, predominantly inattentive type: Secondary | ICD-10-CM | POA: Diagnosis not present

## 2022-02-18 DIAGNOSIS — F329 Major depressive disorder, single episode, unspecified: Secondary | ICD-10-CM | POA: Diagnosis not present

## 2022-03-26 DIAGNOSIS — F329 Major depressive disorder, single episode, unspecified: Secondary | ICD-10-CM | POA: Diagnosis not present

## 2022-03-26 DIAGNOSIS — F341 Dysthymic disorder: Secondary | ICD-10-CM | POA: Diagnosis not present

## 2022-03-26 DIAGNOSIS — F900 Attention-deficit hyperactivity disorder, predominantly inattentive type: Secondary | ICD-10-CM | POA: Diagnosis not present

## 2022-04-22 DIAGNOSIS — F900 Attention-deficit hyperactivity disorder, predominantly inattentive type: Secondary | ICD-10-CM | POA: Diagnosis not present

## 2022-04-22 DIAGNOSIS — F329 Major depressive disorder, single episode, unspecified: Secondary | ICD-10-CM | POA: Diagnosis not present

## 2022-04-22 DIAGNOSIS — F341 Dysthymic disorder: Secondary | ICD-10-CM | POA: Diagnosis not present

## 2022-05-29 DIAGNOSIS — F329 Major depressive disorder, single episode, unspecified: Secondary | ICD-10-CM | POA: Diagnosis not present

## 2022-05-29 DIAGNOSIS — F900 Attention-deficit hyperactivity disorder, predominantly inattentive type: Secondary | ICD-10-CM | POA: Diagnosis not present

## 2022-05-29 DIAGNOSIS — F341 Dysthymic disorder: Secondary | ICD-10-CM | POA: Diagnosis not present

## 2022-06-02 DIAGNOSIS — F900 Attention-deficit hyperactivity disorder, predominantly inattentive type: Secondary | ICD-10-CM | POA: Diagnosis not present

## 2022-06-02 DIAGNOSIS — F341 Dysthymic disorder: Secondary | ICD-10-CM | POA: Diagnosis not present

## 2022-06-02 DIAGNOSIS — F329 Major depressive disorder, single episode, unspecified: Secondary | ICD-10-CM | POA: Diagnosis not present

## 2022-07-10 DIAGNOSIS — F329 Major depressive disorder, single episode, unspecified: Secondary | ICD-10-CM | POA: Diagnosis not present

## 2022-07-10 DIAGNOSIS — F341 Dysthymic disorder: Secondary | ICD-10-CM | POA: Diagnosis not present

## 2022-07-10 DIAGNOSIS — F900 Attention-deficit hyperactivity disorder, predominantly inattentive type: Secondary | ICD-10-CM | POA: Diagnosis not present

## 2022-08-06 DIAGNOSIS — F329 Major depressive disorder, single episode, unspecified: Secondary | ICD-10-CM | POA: Diagnosis not present

## 2022-08-06 DIAGNOSIS — F900 Attention-deficit hyperactivity disorder, predominantly inattentive type: Secondary | ICD-10-CM | POA: Diagnosis not present

## 2022-08-06 DIAGNOSIS — F341 Dysthymic disorder: Secondary | ICD-10-CM | POA: Diagnosis not present

## 2022-09-03 DIAGNOSIS — F900 Attention-deficit hyperactivity disorder, predominantly inattentive type: Secondary | ICD-10-CM | POA: Diagnosis not present

## 2022-09-03 DIAGNOSIS — F341 Dysthymic disorder: Secondary | ICD-10-CM | POA: Diagnosis not present

## 2022-09-03 DIAGNOSIS — F329 Major depressive disorder, single episode, unspecified: Secondary | ICD-10-CM | POA: Diagnosis not present

## 2022-09-09 DIAGNOSIS — F341 Dysthymic disorder: Secondary | ICD-10-CM | POA: Diagnosis not present

## 2022-09-09 DIAGNOSIS — F329 Major depressive disorder, single episode, unspecified: Secondary | ICD-10-CM | POA: Diagnosis not present

## 2022-09-09 DIAGNOSIS — F900 Attention-deficit hyperactivity disorder, predominantly inattentive type: Secondary | ICD-10-CM | POA: Diagnosis not present

## 2022-10-14 DIAGNOSIS — F900 Attention-deficit hyperactivity disorder, predominantly inattentive type: Secondary | ICD-10-CM | POA: Diagnosis not present

## 2022-10-14 DIAGNOSIS — F341 Dysthymic disorder: Secondary | ICD-10-CM | POA: Diagnosis not present

## 2022-10-14 DIAGNOSIS — F329 Major depressive disorder, single episode, unspecified: Secondary | ICD-10-CM | POA: Diagnosis not present

## 2022-11-11 DIAGNOSIS — F329 Major depressive disorder, single episode, unspecified: Secondary | ICD-10-CM | POA: Diagnosis not present

## 2022-11-11 DIAGNOSIS — F341 Dysthymic disorder: Secondary | ICD-10-CM | POA: Diagnosis not present

## 2022-11-11 DIAGNOSIS — F900 Attention-deficit hyperactivity disorder, predominantly inattentive type: Secondary | ICD-10-CM | POA: Diagnosis not present

## 2022-11-19 DIAGNOSIS — F341 Dysthymic disorder: Secondary | ICD-10-CM | POA: Diagnosis not present

## 2022-11-19 DIAGNOSIS — F900 Attention-deficit hyperactivity disorder, predominantly inattentive type: Secondary | ICD-10-CM | POA: Diagnosis not present

## 2022-11-19 DIAGNOSIS — F329 Major depressive disorder, single episode, unspecified: Secondary | ICD-10-CM | POA: Diagnosis not present

## 2022-12-23 DIAGNOSIS — F329 Major depressive disorder, single episode, unspecified: Secondary | ICD-10-CM | POA: Diagnosis not present

## 2022-12-23 DIAGNOSIS — F341 Dysthymic disorder: Secondary | ICD-10-CM | POA: Diagnosis not present

## 2022-12-23 DIAGNOSIS — F900 Attention-deficit hyperactivity disorder, predominantly inattentive type: Secondary | ICD-10-CM | POA: Diagnosis not present

## 2023-01-27 DIAGNOSIS — F341 Dysthymic disorder: Secondary | ICD-10-CM | POA: Diagnosis not present

## 2023-01-27 DIAGNOSIS — F900 Attention-deficit hyperactivity disorder, predominantly inattentive type: Secondary | ICD-10-CM | POA: Diagnosis not present

## 2023-01-27 DIAGNOSIS — F329 Major depressive disorder, single episode, unspecified: Secondary | ICD-10-CM | POA: Diagnosis not present

## 2023-03-02 DIAGNOSIS — F900 Attention-deficit hyperactivity disorder, predominantly inattentive type: Secondary | ICD-10-CM | POA: Diagnosis not present

## 2023-03-02 DIAGNOSIS — F329 Major depressive disorder, single episode, unspecified: Secondary | ICD-10-CM | POA: Diagnosis not present

## 2023-03-02 DIAGNOSIS — F341 Dysthymic disorder: Secondary | ICD-10-CM | POA: Diagnosis not present

## 2023-04-02 DIAGNOSIS — F329 Major depressive disorder, single episode, unspecified: Secondary | ICD-10-CM | POA: Diagnosis not present

## 2023-04-02 DIAGNOSIS — F341 Dysthymic disorder: Secondary | ICD-10-CM | POA: Diagnosis not present

## 2023-04-02 DIAGNOSIS — F900 Attention-deficit hyperactivity disorder, predominantly inattentive type: Secondary | ICD-10-CM | POA: Diagnosis not present

## 2023-06-04 DIAGNOSIS — F341 Dysthymic disorder: Secondary | ICD-10-CM | POA: Diagnosis not present

## 2023-06-04 DIAGNOSIS — F329 Major depressive disorder, single episode, unspecified: Secondary | ICD-10-CM | POA: Diagnosis not present

## 2023-06-04 DIAGNOSIS — F900 Attention-deficit hyperactivity disorder, predominantly inattentive type: Secondary | ICD-10-CM | POA: Diagnosis not present

## 2023-08-04 DIAGNOSIS — F902 Attention-deficit hyperactivity disorder, combined type: Secondary | ICD-10-CM | POA: Diagnosis not present

## 2023-08-04 DIAGNOSIS — F331 Major depressive disorder, recurrent, moderate: Secondary | ICD-10-CM | POA: Diagnosis not present

## 2023-08-04 DIAGNOSIS — F411 Generalized anxiety disorder: Secondary | ICD-10-CM | POA: Diagnosis not present

## 2023-08-14 DIAGNOSIS — Z0001 Encounter for general adult medical examination with abnormal findings: Secondary | ICD-10-CM | POA: Diagnosis not present

## 2023-08-14 DIAGNOSIS — G4730 Sleep apnea, unspecified: Secondary | ICD-10-CM | POA: Diagnosis not present

## 2023-08-14 DIAGNOSIS — E039 Hypothyroidism, unspecified: Secondary | ICD-10-CM | POA: Diagnosis not present

## 2023-08-14 DIAGNOSIS — K5730 Diverticulosis of large intestine without perforation or abscess without bleeding: Secondary | ICD-10-CM | POA: Diagnosis not present

## 2023-08-14 DIAGNOSIS — R7309 Other abnormal glucose: Secondary | ICD-10-CM | POA: Diagnosis not present

## 2023-08-14 DIAGNOSIS — E782 Mixed hyperlipidemia: Secondary | ICD-10-CM | POA: Diagnosis not present

## 2023-08-14 DIAGNOSIS — E663 Overweight: Secondary | ICD-10-CM | POA: Diagnosis not present

## 2023-08-14 DIAGNOSIS — E559 Vitamin D deficiency, unspecified: Secondary | ICD-10-CM | POA: Diagnosis not present

## 2023-08-14 DIAGNOSIS — R5383 Other fatigue: Secondary | ICD-10-CM | POA: Diagnosis not present

## 2023-08-14 DIAGNOSIS — Z6829 Body mass index (BMI) 29.0-29.9, adult: Secondary | ICD-10-CM | POA: Diagnosis not present

## 2023-08-14 DIAGNOSIS — Z1331 Encounter for screening for depression: Secondary | ICD-10-CM | POA: Diagnosis not present

## 2023-08-14 DIAGNOSIS — Z789 Other specified health status: Secondary | ICD-10-CM | POA: Diagnosis not present

## 2023-08-14 DIAGNOSIS — F329 Major depressive disorder, single episode, unspecified: Secondary | ICD-10-CM | POA: Diagnosis not present

## 2023-09-15 DIAGNOSIS — G4733 Obstructive sleep apnea (adult) (pediatric): Secondary | ICD-10-CM | POA: Diagnosis not present

## 2023-09-29 DIAGNOSIS — F331 Major depressive disorder, recurrent, moderate: Secondary | ICD-10-CM | POA: Diagnosis not present

## 2023-09-29 DIAGNOSIS — F902 Attention-deficit hyperactivity disorder, combined type: Secondary | ICD-10-CM | POA: Diagnosis not present

## 2023-09-29 DIAGNOSIS — F411 Generalized anxiety disorder: Secondary | ICD-10-CM | POA: Diagnosis not present

## 2023-10-16 DIAGNOSIS — G4733 Obstructive sleep apnea (adult) (pediatric): Secondary | ICD-10-CM | POA: Diagnosis not present

## 2023-11-16 DIAGNOSIS — G4733 Obstructive sleep apnea (adult) (pediatric): Secondary | ICD-10-CM | POA: Diagnosis not present

## 2023-12-02 DIAGNOSIS — F902 Attention-deficit hyperactivity disorder, combined type: Secondary | ICD-10-CM | POA: Diagnosis not present

## 2023-12-02 DIAGNOSIS — F411 Generalized anxiety disorder: Secondary | ICD-10-CM | POA: Diagnosis not present

## 2023-12-02 DIAGNOSIS — F331 Major depressive disorder, recurrent, moderate: Secondary | ICD-10-CM | POA: Diagnosis not present

## 2023-12-16 DIAGNOSIS — G4733 Obstructive sleep apnea (adult) (pediatric): Secondary | ICD-10-CM | POA: Diagnosis not present

## 2024-01-20 DIAGNOSIS — F411 Generalized anxiety disorder: Secondary | ICD-10-CM | POA: Diagnosis not present

## 2024-01-20 DIAGNOSIS — F902 Attention-deficit hyperactivity disorder, combined type: Secondary | ICD-10-CM | POA: Diagnosis not present

## 2024-01-20 DIAGNOSIS — F331 Major depressive disorder, recurrent, moderate: Secondary | ICD-10-CM | POA: Diagnosis not present
